# Patient Record
Sex: Male | Born: 1940 | Race: White | Hispanic: No | Marital: Married | Smoking: Never smoker
Health system: Southern US, Community
[De-identification: ages and names within clinical notes are randomized; demographics above are authoritative.]

## PROBLEM LIST (undated history)

## (undated) DIAGNOSIS — Z952 Presence of prosthetic heart valve: Secondary | ICD-10-CM

## (undated) DIAGNOSIS — E669 Obesity, unspecified: Secondary | ICD-10-CM

## (undated) DIAGNOSIS — E1169 Type 2 diabetes mellitus with other specified complication: Secondary | ICD-10-CM

## (undated) DIAGNOSIS — I519 Heart disease, unspecified: Secondary | ICD-10-CM

## (undated) DIAGNOSIS — I10 Essential (primary) hypertension: Secondary | ICD-10-CM

## (undated) HISTORY — PX: BACK SURGERY: SHX140

## (undated) HISTORY — PX: VALVE REPLACEMENT: SUR13

## (undated) HISTORY — PX: APPENDECTOMY: SHX54

---

## 1999-04-07 ENCOUNTER — Encounter (HOSPITAL_BASED_OUTPATIENT_CLINIC_OR_DEPARTMENT_OTHER): Payer: Self-pay | Admitting: General Surgery

## 1999-04-08 ENCOUNTER — Inpatient Hospital Stay (HOSPITAL_COMMUNITY): Admission: EM | Admit: 1999-04-08 | Discharge: 1999-04-09 | Payer: Self-pay | Admitting: Emergency Medicine

## 2012-11-02 ENCOUNTER — Emergency Department (HOSPITAL_COMMUNITY): Payer: PRIVATE HEALTH INSURANCE

## 2012-11-02 ENCOUNTER — Emergency Department (HOSPITAL_COMMUNITY)
Admission: EM | Admit: 2012-11-02 | Discharge: 2012-11-02 | Disposition: A | Discharge status: Home / Self Care | Payer: PRIVATE HEALTH INSURANCE | Attending: Emergency Medicine | Admitting: Emergency Medicine

## 2012-11-02 ENCOUNTER — Encounter (HOSPITAL_COMMUNITY): Payer: Self-pay | Admitting: Emergency Medicine

## 2012-11-02 DIAGNOSIS — Z79899 Other long term (current) drug therapy: Secondary | ICD-10-CM | POA: Insufficient documentation

## 2012-11-02 DIAGNOSIS — Z7901 Long term (current) use of anticoagulants: Secondary | ICD-10-CM | POA: Insufficient documentation

## 2012-11-02 DIAGNOSIS — Z9089 Acquired absence of other organs: Secondary | ICD-10-CM | POA: Insufficient documentation

## 2012-11-02 DIAGNOSIS — K5900 Constipation, unspecified: Secondary | ICD-10-CM | POA: Insufficient documentation

## 2012-11-02 DIAGNOSIS — Z8679 Personal history of other diseases of the circulatory system: Secondary | ICD-10-CM | POA: Insufficient documentation

## 2012-11-02 DIAGNOSIS — N200 Calculus of kidney: Secondary | ICD-10-CM

## 2012-11-02 DIAGNOSIS — N23 Unspecified renal colic: Secondary | ICD-10-CM | POA: Insufficient documentation

## 2012-11-02 HISTORY — DX: Heart disease, unspecified: I51.9

## 2012-11-02 LAB — URINALYSIS, ROUTINE W REFLEX MICROSCOPIC
Nitrite: NEGATIVE
Specific Gravity, Urine: 1.03 (ref 1.005–1.030)
Urobilinogen, UA: 0.2 mg/dL (ref 0.0–1.0)
pH: 5 (ref 5.0–8.0)

## 2012-11-02 LAB — PROTIME-INR: Prothrombin Time: 29.4 seconds — ABNORMAL HIGH (ref 11.6–15.2)

## 2012-11-02 LAB — CBC WITH DIFFERENTIAL/PLATELET
Eosinophils Absolute: 0 10*3/uL (ref 0.0–0.7)
Hemoglobin: 16.3 g/dL (ref 13.0–17.0)
Lymphocytes Relative: 7 % — ABNORMAL LOW (ref 12–46)
Lymphs Abs: 1.3 10*3/uL (ref 0.7–4.0)
Monocytes Relative: 8 % (ref 3–12)
Neutro Abs: 17 10*3/uL — ABNORMAL HIGH (ref 1.7–7.7)
Neutrophils Relative %: 85 % — ABNORMAL HIGH (ref 43–77)
Platelets: 279 10*3/uL (ref 150–400)
RBC: 5.27 MIL/uL (ref 4.22–5.81)
WBC: 20 10*3/uL — ABNORMAL HIGH (ref 4.0–10.5)

## 2012-11-02 LAB — BASIC METABOLIC PANEL
Chloride: 94 mEq/L — ABNORMAL LOW (ref 96–112)
GFR calc non Af Amer: 53 mL/min — ABNORMAL LOW (ref >90)
Glucose, Bld: 197 mg/dL — ABNORMAL HIGH (ref 70–99)
Potassium: 4.2 mEq/L (ref 3.5–5.1)
Sodium: 134 mEq/L — ABNORMAL LOW (ref 135–145)

## 2012-11-02 LAB — HEPATIC FUNCTION PANEL
ALT: 17 U/L (ref 0–53)
Albumin: 3.8 g/dL (ref 3.5–5.2)
Indirect Bilirubin: 0.8 mg/dL (ref 0.3–0.9)
Total Protein: 6.9 g/dL (ref 6.0–8.3)

## 2012-11-02 LAB — URINE MICROSCOPIC-ADD ON

## 2012-11-02 LAB — LIPASE, BLOOD: Lipase: 21 U/L (ref 11–59)

## 2012-11-02 MED ORDER — IOHEXOL 300 MG/ML  SOLN
50.0000 mL | Freq: Once | INTRAMUSCULAR | Status: AC | PRN
  Administered 2012-11-02: 50 mL via ORAL

## 2012-11-02 MED ORDER — SODIUM CHLORIDE 0.9 % IV BOLUS (SEPSIS)
1000.0000 mL | Freq: Once | INTRAVENOUS | Status: AC
  Administered 2012-11-02: 1000 mL via INTRAVENOUS

## 2012-11-02 MED ORDER — HYDROCODONE-ACETAMINOPHEN 5-325 MG PO TABS: 1.0000 | ORAL_TABLET | Freq: Four times a day (QID) | ORAL | Status: DC | PRN

## 2012-11-02 MED ORDER — TAMSULOSIN HCL 0.4 MG PO CAPS: 0.4000 mg | ORAL_CAPSULE | Freq: Every day | ORAL | Status: DC

## 2012-11-02 MED ORDER — IOHEXOL 300 MG/ML  SOLN
100.0000 mL | Freq: Once | INTRAMUSCULAR | Status: AC | PRN
  Administered 2012-11-02: 100 mL via INTRAVENOUS

## 2012-11-02 MED ORDER — ONDANSETRON HCL 4 MG/2ML IJ SOLN
4.0000 mg | Freq: Once | INTRAMUSCULAR | Status: AC
  Administered 2012-11-02: 4 mg via INTRAVENOUS
  Filled 2012-11-02: qty 2

## 2012-11-02 MED ORDER — HYDROMORPHONE HCL PF 1 MG/ML IJ SOLN
1.0000 mg | Freq: Once | INTRAMUSCULAR | Status: AC
  Administered 2012-11-02: 1 mg via INTRAVENOUS
  Filled 2012-11-02: qty 1

## 2012-11-02 NOTE — ED Notes (Signed)
Patient transported to CT 

## 2012-11-02 NOTE — ED Provider Notes (Addendum)
Pt with CT that showed 5x5 right renal calculi which explains his pain.  Also blood in the urine without infection.  Pt is comfortable on exam.  Discussed with pt and he will f/u with urology if sx do not improve.  Gwyneth Sprout, MD 11/02/12 1730  Gwyneth Sprout, MD 11/02/12 1736

## 2012-11-02 NOTE — Progress Notes (Signed)
Pt confirms pcp is Wonda Cerise at [facility] EPIC updated

## 2012-11-02 NOTE — ED Notes (Signed)
Pt has had abd pain and no bm  For a few days and gave self enema with no results. Has an abd hernia also and it has been hurting him. N/v today.

## 2012-11-02 NOTE — ED Provider Notes (Signed)
History     CSN: [id number]  Arrival date & time 11/02/12  1308   First MD Initiated Contact with Patient 11/02/12 1334      Chief Complaint  Patient presents with  . Abdominal Pain  . Constipation    (Consider location/radiation/quality/duration/timing/severity/associated sxs/prior treatment) Patient is a 72 y.o. male presenting with abdominal pain and constipation. The history is provided by the patient (the pt complains of abd pain and constipation). No language interpreter was used.  Abdominal Pain The primary symptoms of the illness include abdominal pain. The primary symptoms of the illness do not include fatigue or diarrhea. The current episode started 13 to 24 hours ago. The onset of the illness was gradual. The problem has not changed since onset. Associated with: nothing. The patient has not had a change in bowel habit. Additional symptoms associated with the illness include constipation. Symptoms associated with the illness do not include hematuria, frequency or back pain. Significant associated medical issues do not include sickle cell disease.  Constipation  Associated symptoms include abdominal pain. Pertinent negatives include no diarrhea, no hematuria, no chest pain, no headaches, no coughing and no rash.    Past Medical History  Diagnosis Date  . Heart disorder     valve     Past Surgical History  Procedure Date  . Appendectomy   . Back surgery   . Valve replacement     No family history on file.  History  Substance Use Topics  . Smoking status: Not on file  . Smokeless tobacco: Not on file  . Alcohol Use:       Review of Systems  Constitutional: Negative for fatigue.  HENT: Negative for congestion, sinus pressure and ear discharge.   Eyes: Negative for discharge.  Respiratory: Negative for cough.   Cardiovascular: Negative for chest pain.  Gastrointestinal: Positive for abdominal pain and constipation. Negative for diarrhea.  Genitourinary:  Negative for frequency and hematuria.  Musculoskeletal: Negative for back pain.  Skin: Negative for rash.  Neurological: Negative for seizures and headaches.  Hematological: Negative.   Psychiatric/Behavioral: Negative for hallucinations.    Allergies  Review of patient's allergies indicates no known allergies.  Home Medications   Current Outpatient Rx  Name  Route  Sig  Dispense  Refill  . ALPHA LIPOIC ACID PO   Oral   Take 1 capsule by mouth daily.         Marland Kitchen VITAMIN B-12 PO   Oral   Take 1 tablet by mouth daily.         Marland Kitchen EZETIMIBE 10 MG PO TABS   Oral   Take 10 mg by mouth daily.         . OMEGA-3 FATTY ACIDS 1000 MG PO CAPS   Oral   Take 1 g by mouth daily.         Marland Kitchen FOLIC ACID 1 MG PO TABS   Oral   Take 1 mg by mouth daily.         Marland Kitchen GARLIC PO   Oral   Take 1 tablet by mouth daily.         Marland Kitchen GLIMEPIRIDE 4 MG PO TABS   Oral   Take 4 mg by mouth daily before breakfast.         . LISINOPRIL-HYDROCHLOROTHIAZIDE 20-12.5 MG PO TABS   Oral   Take 1 tablet by mouth daily.         Marland Kitchen METFORMIN HCL 1000 MG PO TABS  Oral   Take 1,000 mg by mouth 2 (two) times daily with a meal.         . METOPROLOL TARTRATE 100 MG PO TABS   Oral   Take 100 mg by mouth daily.         Marland Kitchen ROSUVASTATIN CALCIUM 20 MG PO TABS   Oral   Take 20 mg by mouth daily.         . WARFARIN SODIUM 7.5 MG PO TABS   Oral   Take 7.5 mg by mouth at bedtime.           BP 185/96  Pulse 88  Temp 98 F (36.7 C) (Oral)  Resp 20  SpO2 100%  Physical Exam  Constitutional: He is oriented to person, place, and time. He appears well-developed.  HENT:  Head: Normocephalic and atraumatic.  Eyes: Conjunctivae normal and EOM are normal. No scleral icterus.  Neck: Neck supple. No thyromegaly present.  Cardiovascular: Normal rate and regular rhythm.  Exam reveals no gallop and no friction rub.   No murmur heard. Pulmonary/Chest: No stridor. He has no wheezes. He has no  rales. He exhibits no tenderness.  Abdominal: He exhibits no distension. There is tenderness. There is no rebound.       Mild rlq tender  Musculoskeletal: Normal range of motion. He exhibits no edema.  Lymphadenopathy:    He has no cervical adenopathy.  Neurological: He is oriented to person, place, and time. Coordination normal.  Skin: No rash noted. No erythema.  Psychiatric: He has a normal mood and affect. His behavior is normal.    ED Course  Procedures (including critical care time)  Labs Reviewed  CBC WITH DIFFERENTIAL - Abnormal; Notable for the following:    WBC 20.0 (*)     Neutrophils Relative 85 (*)     Neutro Abs 17.0 (*)     Lymphocytes Relative 7 (*)     Monocytes Absolute 1.7 (*)     All other components within normal limits  BASIC METABOLIC PANEL - Abnormal; Notable for the following:    Sodium 134 (*)     Chloride 94 (*)     Glucose, Bld 197 (*)     GFR calc non Af Amer 53 (*)     GFR calc Af Amer 62 (*)     All other components within normal limits  URINALYSIS, ROUTINE W REFLEX MICROSCOPIC - Abnormal; Notable for the following:    Hgb urine dipstick MODERATE (*)     Ketones, ur 40 (*)     Protein, ur 100 (*)     All other components within normal limits  PROTIME-INR - Abnormal; Notable for the following:    Prothrombin Time 29.4 (*)     INR 2.98 (*)     All other components within normal limits  URINE MICROSCOPIC-ADD ON - Abnormal; Notable for the following:    Casts HYALINE CASTS (*)     All other components within normal limits  LIPASE, BLOOD  HEPATIC FUNCTION PANEL   Dg Abd Acute W/chest  11/02/2012  *RADIOLOGY REPORT*  Clinical Data: Abdominal pain and constipation.  ACUTE ABDOMEN SERIES (ABDOMEN 2 VIEW & CHEST 1 VIEW)  Comparison: None.  Findings: The lungs show bibasilar atelectasis.  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  Abdominal films show some degree of gaseous distention of the  colon with mild gaseous prominence of small bowel.  A few air-fluid  levels are present in the colon and findings may be consistent with enteritis and associated ileus. There is no evidence of overt bowel obstruction or signs of free air.  No abnormal calcifications are seen.  IMPRESSION: Some gaseous prominence of the colon and, to a lesser extent, small bowel.  Findings may be consistent with ileus/enteritis.   Original Report Authenticated By: Irish Lack, M.D.      No diagnosis found.    MDM          Benny Lennert, MD 11/02/12 [phone]

## 2021-03-21 ENCOUNTER — Inpatient Hospital Stay (HOSPITAL_COMMUNITY): Payer: Medicare Other

## 2021-03-21 ENCOUNTER — Emergency Department (HOSPITAL_BASED_OUTPATIENT_CLINIC_OR_DEPARTMENT_OTHER): Payer: Medicare Other

## 2021-03-21 ENCOUNTER — Encounter (HOSPITAL_BASED_OUTPATIENT_CLINIC_OR_DEPARTMENT_OTHER): Payer: Self-pay

## 2021-03-21 ENCOUNTER — Inpatient Hospital Stay (HOSPITAL_BASED_OUTPATIENT_CLINIC_OR_DEPARTMENT_OTHER)
Admission: EM | Admit: 2021-03-21 | Discharge: 2021-03-23 | DRG: 065 | Disposition: A | Discharge status: Home Health | Payer: Medicare Other | Attending: Internal Medicine | Admitting: Internal Medicine

## 2021-03-21 ENCOUNTER — Other Ambulatory Visit: Payer: Self-pay

## 2021-03-21 DIAGNOSIS — Z87891 Personal history of nicotine dependence: Secondary | ICD-10-CM

## 2021-03-21 DIAGNOSIS — I6389 Other cerebral infarction: Secondary | ICD-10-CM | POA: Diagnosis not present

## 2021-03-21 DIAGNOSIS — E876 Hypokalemia: Secondary | ICD-10-CM | POA: Diagnosis present

## 2021-03-21 DIAGNOSIS — I10 Essential (primary) hypertension: Secondary | ICD-10-CM | POA: Diagnosis present

## 2021-03-21 DIAGNOSIS — R471 Dysarthria and anarthria: Secondary | ICD-10-CM | POA: Diagnosis present

## 2021-03-21 DIAGNOSIS — R4701 Aphasia: Secondary | ICD-10-CM | POA: Diagnosis present

## 2021-03-21 DIAGNOSIS — E785 Hyperlipidemia, unspecified: Secondary | ICD-10-CM | POA: Diagnosis present

## 2021-03-21 DIAGNOSIS — E1165 Type 2 diabetes mellitus with hyperglycemia: Secondary | ICD-10-CM | POA: Diagnosis present

## 2021-03-21 DIAGNOSIS — R29702 NIHSS score 2: Secondary | ICD-10-CM | POA: Diagnosis present

## 2021-03-21 DIAGNOSIS — R8271 Bacteriuria: Secondary | ICD-10-CM | POA: Diagnosis present

## 2021-03-21 DIAGNOSIS — Z952 Presence of prosthetic heart valve: Secondary | ICD-10-CM

## 2021-03-21 DIAGNOSIS — R29810 Facial weakness: Secondary | ICD-10-CM | POA: Diagnosis present

## 2021-03-21 DIAGNOSIS — R3129 Other microscopic hematuria: Secondary | ICD-10-CM | POA: Diagnosis present

## 2021-03-21 DIAGNOSIS — E1169 Type 2 diabetes mellitus with other specified complication: Secondary | ICD-10-CM | POA: Diagnosis present

## 2021-03-21 DIAGNOSIS — T45515A Adverse effect of anticoagulants, initial encounter: Secondary | ICD-10-CM | POA: Diagnosis present

## 2021-03-21 DIAGNOSIS — I6330 Cerebral infarction due to thrombosis of unspecified cerebral artery: Secondary | ICD-10-CM | POA: Diagnosis not present

## 2021-03-21 DIAGNOSIS — Z823 Family history of stroke: Secondary | ICD-10-CM

## 2021-03-21 DIAGNOSIS — Z7984 Long term (current) use of oral hypoglycemic drugs: Secondary | ICD-10-CM | POA: Diagnosis not present

## 2021-03-21 DIAGNOSIS — Z8249 Family history of ischemic heart disease and other diseases of the circulatory system: Secondary | ICD-10-CM

## 2021-03-21 DIAGNOSIS — E119 Type 2 diabetes mellitus without complications: Secondary | ICD-10-CM | POA: Diagnosis present

## 2021-03-21 DIAGNOSIS — D72829 Elevated white blood cell count, unspecified: Secondary | ICD-10-CM | POA: Diagnosis present

## 2021-03-21 DIAGNOSIS — Z20822 Contact with and (suspected) exposure to covid-19: Secondary | ICD-10-CM | POA: Diagnosis present

## 2021-03-21 DIAGNOSIS — R8290 Unspecified abnormal findings in urine: Secondary | ICD-10-CM | POA: Diagnosis present

## 2021-03-21 DIAGNOSIS — G8191 Hemiplegia, unspecified affecting right dominant side: Secondary | ICD-10-CM | POA: Diagnosis present

## 2021-03-21 DIAGNOSIS — E669 Obesity, unspecified: Secondary | ICD-10-CM | POA: Diagnosis present

## 2021-03-21 DIAGNOSIS — I6381 Other cerebral infarction due to occlusion or stenosis of small artery: Principal | ICD-10-CM | POA: Diagnosis present

## 2021-03-21 DIAGNOSIS — Z79899 Other long term (current) drug therapy: Secondary | ICD-10-CM | POA: Diagnosis not present

## 2021-03-21 DIAGNOSIS — Z6830 Body mass index (BMI) 30.0-30.9, adult: Secondary | ICD-10-CM

## 2021-03-21 DIAGNOSIS — I639 Cerebral infarction, unspecified: Secondary | ICD-10-CM

## 2021-03-21 DIAGNOSIS — R531 Weakness: Secondary | ICD-10-CM | POA: Diagnosis not present

## 2021-03-21 DIAGNOSIS — Z7901 Long term (current) use of anticoagulants: Secondary | ICD-10-CM | POA: Diagnosis not present

## 2021-03-21 HISTORY — DX: Presence of prosthetic heart valve: Z95.2

## 2021-03-21 HISTORY — DX: Type 2 diabetes mellitus with other specified complication: E11.69

## 2021-03-21 HISTORY — DX: Essential (primary) hypertension: I10

## 2021-03-21 HISTORY — DX: Obesity, unspecified: E66.9

## 2021-03-21 LAB — DIFFERENTIAL
Abs Immature Granulocytes: 0.03 10*3/uL (ref 0.00–0.07)
Basophils Absolute: 0.1 10*3/uL (ref 0.0–0.1)
Basophils Relative: 1 %
Eosinophils Absolute: 0.1 10*3/uL (ref 0.0–0.5)
Eosinophils Relative: 1 %
Immature Granulocytes: 0 %
Lymphocytes Relative: 10 %
Lymphs Abs: 1 10*3/uL (ref 0.7–4.0)
Monocytes Absolute: 0.8 10*3/uL (ref 0.1–1.0)
Monocytes Relative: 8 %
Neutro Abs: 8.7 10*3/uL — ABNORMAL HIGH (ref 1.7–7.7)
Neutrophils Relative %: 80 %

## 2021-03-21 LAB — URINALYSIS, ROUTINE W REFLEX MICROSCOPIC
Bilirubin Urine: NEGATIVE
Glucose, UA: NEGATIVE mg/dL
Ketones, ur: 15 mg/dL — AB
Leukocytes,Ua: NEGATIVE
Nitrite: NEGATIVE
Protein, ur: >300 mg/dL — AB
Specific Gravity, Urine: 1.01 (ref 1.005–1.030)
pH: 6 (ref 5.0–8.0)

## 2021-03-21 LAB — COMPREHENSIVE METABOLIC PANEL
ALT: 25 U/L (ref 0–44)
AST: 28 U/L (ref 15–41)
Albumin: 4.2 g/dL (ref 3.5–5.0)
Alkaline Phosphatase: 70 U/L (ref 38–126)
Anion gap: 11 (ref 5–15)
BUN: 10 mg/dL (ref 8–23)
CO2: 26 mmol/L (ref 22–32)
Calcium: 8.8 mg/dL — ABNORMAL LOW (ref 8.9–10.3)
Chloride: 101 mmol/L (ref 98–111)
Creatinine, Ser: 1.02 mg/dL (ref 0.61–1.24)
GFR, Estimated: >60 mL/min (ref >60)
Glucose, Bld: 154 mg/dL — ABNORMAL HIGH (ref 70–99)
Potassium: 3.6 mmol/L (ref 3.5–5.1)
Sodium: 138 mmol/L (ref 135–145)
Total Bilirubin: 1 mg/dL (ref 0.3–1.2)
Total Protein: 7 g/dL (ref 6.5–8.1)

## 2021-03-21 LAB — URINALYSIS, MICROSCOPIC (REFLEX)

## 2021-03-21 LAB — LIPID PANEL
Cholesterol: 108 mg/dL (ref 0–200)
HDL: 30 mg/dL — ABNORMAL LOW (ref >40)
LDL Cholesterol: 58 mg/dL (ref 0–99)
Total CHOL/HDL Ratio: 3.6 RATIO
Triglycerides: 98 mg/dL (ref <150)
VLDL: 20 mg/dL (ref 0–40)

## 2021-03-21 LAB — RAPID URINE DRUG SCREEN, HOSP PERFORMED
Amphetamines: NOT DETECTED
Barbiturates: NOT DETECTED
Benzodiazepines: NOT DETECTED
Cocaine: NOT DETECTED
Opiates: NOT DETECTED
Tetrahydrocannabinol: NOT DETECTED

## 2021-03-21 LAB — CBC
HCT: 48.4 % (ref 39.0–52.0)
Hemoglobin: 16.4 g/dL (ref 13.0–17.0)
MCH: 30.4 pg (ref 26.0–34.0)
MCHC: 33.9 g/dL (ref 30.0–36.0)
MCV: 89.8 fL (ref 80.0–100.0)
Platelets: 252 10*3/uL (ref 150–400)
RBC: 5.39 MIL/uL (ref 4.22–5.81)
RDW: 13.1 % (ref 11.5–15.5)
WBC: 10.7 10*3/uL — ABNORMAL HIGH (ref 4.0–10.5)
nRBC: 0 % (ref 0.0–0.2)

## 2021-03-21 LAB — ETHANOL: Alcohol, Ethyl (B): <10 mg/dL (ref <10)

## 2021-03-21 LAB — PROTIME-INR
INR: 3.4 — ABNORMAL HIGH (ref 0.8–1.2)
Prothrombin Time: 34.3 seconds — ABNORMAL HIGH (ref 11.4–15.2)

## 2021-03-21 LAB — CBG MONITORING, ED: Glucose-Capillary: 167 mg/dL — ABNORMAL HIGH (ref 70–99)

## 2021-03-21 LAB — APTT: aPTT: 46 seconds — ABNORMAL HIGH (ref 24–36)

## 2021-03-21 LAB — RESP PANEL BY RT-PCR (FLU A&B, COVID) ARPGX2
Influenza A by PCR: NEGATIVE
Influenza B by PCR: NEGATIVE
SARS Coronavirus 2 by RT PCR: NEGATIVE

## 2021-03-21 LAB — GLUCOSE, CAPILLARY: Glucose-Capillary: 179 mg/dL — ABNORMAL HIGH (ref 70–99)

## 2021-03-21 MED ORDER — ROSUVASTATIN CALCIUM 20 MG PO TABS
20.0000 mg | ORAL_TABLET | Freq: Every day | ORAL | Status: DC
  Administered 2021-03-21 – 2021-03-23 (×3): 20 mg via ORAL
  Filled 2021-03-21 (×3): qty 1

## 2021-03-21 MED ORDER — CALCIUM GLUCONATE-NACL 1-0.675 GM/50ML-% IV SOLN
1.0000 g | Freq: Once | INTRAVENOUS | Status: AC
  Administered 2021-03-21: 1000 mg via INTRAVENOUS
  Filled 2021-03-21: qty 50

## 2021-03-21 MED ORDER — WARFARIN SODIUM 7.5 MG PO TABS
7.5000 mg | ORAL_TABLET | Freq: Once | ORAL | Status: AC
  Administered 2021-03-21: 7.5 mg via ORAL
  Filled 2021-03-21: qty 1

## 2021-03-21 MED ORDER — EZETIMIBE 10 MG PO TABS
10.0000 mg | ORAL_TABLET | Freq: Every day | ORAL | Status: DC
  Administered 2021-03-21 – 2021-03-23 (×3): 10 mg via ORAL
  Filled 2021-03-21 (×3): qty 1

## 2021-03-21 MED ORDER — INSULIN ASPART 100 UNIT/ML IJ SOLN
0.0000 [IU] | Freq: Three times a day (TID) | INTRAMUSCULAR | Status: DC
  Administered 2021-03-22: 2 [IU] via SUBCUTANEOUS
  Administered 2021-03-22: 5 [IU] via SUBCUTANEOUS
  Administered 2021-03-22: 2 [IU] via SUBCUTANEOUS
  Administered 2021-03-23: 3 [IU] via SUBCUTANEOUS

## 2021-03-21 MED ORDER — SENNOSIDES-DOCUSATE SODIUM 8.6-50 MG PO TABS: 1.0000 | ORAL_TABLET | Freq: Every evening | ORAL | Status: DC | PRN

## 2021-03-21 MED ORDER — STROKE: EARLY STAGES OF RECOVERY BOOK
Freq: Once | Status: AC
  Filled 2021-03-21: qty 1

## 2021-03-21 MED ORDER — ACETAMINOPHEN 160 MG/5ML PO SOLN: 650.0000 mg | ORAL | Status: DC | PRN

## 2021-03-21 MED ORDER — INSULIN ASPART 100 UNIT/ML IJ SOLN: 0.0000 [IU] | Freq: Every day | INTRAMUSCULAR | Status: DC

## 2021-03-21 MED ORDER — WARFARIN - PHARMACIST DOSING INPATIENT: Freq: Every day | Status: DC

## 2021-03-21 MED ORDER — ACETAMINOPHEN 650 MG RE SUPP: 650.0000 mg | RECTAL | Status: DC | PRN

## 2021-03-21 MED ORDER — ACETAMINOPHEN 325 MG PO TABS
650.0000 mg | ORAL_TABLET | ORAL | Status: DC | PRN
  Administered 2021-03-22: 650 mg via ORAL
  Filled 2021-03-21: qty 2

## 2021-03-21 NOTE — ED Notes (Signed)
Attempted to call patient's wife to inform her of patient room at [facility] and [facility] at bedside for transfer. No answer at this time.

## 2021-03-21 NOTE — ED Provider Notes (Signed)
[facility] EMERGENCY DEPARTMENT Provider Note  CSN: [id number] Arrival date & time: 03/21/21 0850    History Chief Complaint  Patient presents with   Aphasia    Joseph Padilla is a 80 y.o. 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 He thought maybe it was an allergic reaction to something he ate. He denies any headache, blurry vision, numbness or weakness in his arms or legs. He states he feels a little off balance with walking but has not had a fall or any injuries.    Past Medical History:  Diagnosis Date   Heart disorder    valve     Past Surgical History:  Procedure Laterality Date   APPENDECTOMY     BACK SURGERY     VALVE REPLACEMENT      History reviewed. No pertinent family history.  Social History   Tobacco Use   Smoking status: Never   Smokeless tobacco: Never  Vaping Use   Vaping Use: Never used  Substance Use Topics   Alcohol use: Never   Drug use: Never     Home Medications Prior to Admission medications   Medication Sig Start Date End Date Taking? Authorizing Provider  ALPHA LIPOIC ACID PO Take 1 capsule by mouth daily.    [provider]  Cyanocobalamin (VITAMIN B-12 PO) Take 1 tablet by mouth daily.    [provider]  ezetimibe (ZETIA) 10 MG tablet Take 10 mg by mouth daily.    [provider]  fish oil-omega-3 fatty acids 1000 MG capsule Take 1 g by mouth daily.    [provider]  folic acid (FOLVITE) 1 MG tablet Take 1 mg by mouth daily.    [provider]  GARLIC PO Take 1 tablet by mouth daily.    [provider]  glimepiride (AMARYL) 4 MG tablet Take 4 mg by mouth daily before breakfast.    [provider]  HYDROcodone-acetaminophen (NORCO/VICODIN) 5-325 MG per tablet Take 1 tablet by mouth every 6 (six) hours as needed for pain. 11/02/12   Gwyneth Sprout,  MD  lisinopril-hydrochlorothiazide (PRINZIDE,ZESTORETIC) 20-12.5 MG per tablet Take 1 tablet by mouth daily.    [provider]  metFORMIN (GLUCOPHAGE) 1000 MG tablet Take 1,000 mg by mouth 2 (two) times daily with a meal.    [provider]  metoprolol (LOPRESSOR) 100 MG tablet Take 100 mg by mouth daily.    [provider]  rosuvastatin (CRESTOR) 20 MG tablet Take 20 mg by mouth daily.    [provider]  Tamsulosin HCl (FLOMAX) 0.4 MG CAPS Take 1 capsule (0.4 mg total) by mouth daily after supper. 11/02/12   Gwyneth Sprout, MD  warfarin (COUMADIN) 7.5 MG tablet Take 7.5 mg by mouth at bedtime.    [provider]     Allergies    Patient has no known allergies.   Review of Systems   Review of Systems A comprehensive review of systems was completed and negative except as noted in HPI.    Physical Exam BP (!) 165/93   Pulse 88   Temp 98 F (36.7 C) (Oral)   Resp (!) 24   Ht 5\' 11"  (1.803 m)   Wt 100.5 kg   SpO2 97%   BMI 30.89 kg/m   Physical Exam Vitals and nursing note reviewed.  Constitutional:      Appearance: Normal appearance.  HENT:     Head: Normocephalic and atraumatic.     Nose: Nose normal.     Mouth/Throat:     Mouth: Mucous membranes are moist.  Eyes:     Extraocular Movements: Extraocular movements intact.     Conjunctiva/sclera: Conjunctivae normal.  Cardiovascular:     Rate and Rhythm: Normal rate.  Pulmonary:     Effort: Pulmonary effort is normal.     Breath sounds: Normal breath sounds.  Abdominal:     General: Abdomen is flat.     Palpations: Abdomen is soft.     Tenderness: There is no abdominal tenderness.  Musculoskeletal:        General: No swelling. Normal range of motion.     Cervical back: Neck supple.  Skin:    General: Skin is warm and dry.  Neurological:     Mental Status: He is alert.     Cranial Nerves: Cranial nerve deficit (R facial droop, mild dysarthria) present.     Sensory:  No sensory deficit.     Motor: No weakness.     Coordination: Coordination normal.  Psychiatric:        Mood and Affect: Mood normal.     ED Results / Procedures / Treatments   Labs (all labs ordered are listed, but only abnormal results are displayed) Labs Reviewed  PROTIME-INR - Abnormal; Notable for the following components:      Result Value   Prothrombin Time 34.3 (*)    INR 3.4 (*)    All other components within normal limits  APTT - Abnormal; Notable for the following components:   aPTT 46 (*)    All other components within normal limits  CBC - Abnormal; Notable for the following components:   WBC 10.7 (*)    All other components within normal limits  DIFFERENTIAL - Abnormal; Notable for the following components:   Neutro Abs 8.7 (*)    All other components within normal limits  COMPREHENSIVE METABOLIC PANEL - Abnormal; Notable for the following components:   Glucose, Bld 154 (*)    Calcium 8.8 (*)    All other components within normal limits  URINALYSIS, ROUTINE W REFLEX MICROSCOPIC - Abnormal; Notable for the following components:   Hgb urine dipstick MODERATE (*)    Ketones, ur 15 (*)    Protein, ur >300 (*)    All other components within normal limits  URINALYSIS, MICROSCOPIC (REFLEX) - Abnormal; Notable for the following components:   Bacteria, UA MANY (*)    All other components within normal limits  CBG MONITORING, ED - Abnormal; Notable for the following components:   Glucose-Capillary 167 (*)    All other components within normal limits  RESP PANEL BY RT-PCR (FLU A&B, COVID) ARPGX2  ETHANOL  RAPID URINE DRUG SCREEN, HOSP PERFORMED    EKG EKG Interpretation  Date/Time:  Sunday March 21 2021 09:00:39 EDT Ventricular Rate:  83 PR Interval:  174 QRS Duration: 168 QT Interval:  435 QTC Calculation: 512 R Axis:   115 Text Interpretation: Sinus rhythm Ventricular premature complex RBBB and LPFB Since last tracing 12Jul2000 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  Confirmed by Susy Frizzle [phone]) on 03/21/2021 9:08:47 AM   Radiology CT HEAD WO CONTRAST  Result Date: 03/21/2021 CLINICAL DATA:  Slurred speech, right facial droop, and weakness. EXAM: CT HEAD WITHOUT CONTRAST TECHNIQUE: Contiguous axial images were obtained from the base of the skull through the vertex without intravenous contrast. COMPARISON:  None. FINDINGS: Brain: There is  no evidence of an acute large territory infarct, intracranial hemorrhage, mass, midline shift, or extra-axial fluid collection. There is mild cerebral atrophy. Patchy hypodensities in the cerebral white matter bilaterally are nonspecific but compatible with moderate chronic small vessel ischemic disease. There are lacunar infarcts in the basal ganglia/deep white matter bilaterally which are of indeterminate age. A small chronic infarct is noted inferiorly in the left cerebellar hemisphere. Vascular: Calcified atherosclerosis at the skull base. No hyperdense vessel. Skull: No fracture or suspicious osseous lesion. Sinuses/Orbits: Minimal mucosal thickening in the left maxillary sinus. Clear mastoid air cells. Unremarkable orbits. Other: None. IMPRESSION: 1. No evidence of an acute large territory infarct or intracranial hemorrhage. 2. Moderate chronic small vessel ischemic disease with age indeterminate lacunar infarcts in the basal ganglia/deep white matter bilaterally. 3. Small chronic left cerebellar infarct. Electronically Signed   By: Sebastian Ache M.D.   On: 03/21/2021 10:00    Procedures Procedures  Medications Ordered in the ED Medications - No data to display   MDM Rules/Calculators/A&P MDM Patient with symptoms concerning for acute stroke. Given onset ~24hours ago he is not a candidate for tPA. He is on coumadin, raising concern for ICH. Will send for head CT. Check labs and EKG.   ED Course  I have reviewed the triage vital signs and the nursing notes.  Pertinent labs & imaging results that were available  during my care of the patient were reviewed by me and considered in my medical decision making (see chart for details).  Clinical Course as of 03/21/21 1210  Sun Mar 21, 2021  1749 CBC, CMP are unremarkable INR is therapeutic.  [CS]  1009 CT head imaging reviewed, no acute process, old chronic appearing disease. Will plan admission for further evaluation of his dysarthria and facial droop.  [CS]  1036 Spoke with Dr. Arlyss Queen, who will accept for admission.  [CS]    Clinical Course User Index [CS] Pollyann Savoy, MD    Final Clinical Impression(s) / ED Diagnoses Final diagnoses:  Dysarthria    Rx / DC Orders ED Discharge Orders     None        Pollyann Savoy, MD 03/21/21 1210

## 2021-03-21 NOTE — Progress Notes (Signed)
ANTICOAGULATION CONSULT NOTE - Initial Consult  Pharmacy Consult for warfarin Indication: atrial fibrillation and mechanical aortic valve  No Known Allergies  Patient Measurements: Height: 5\' 11"  (180.3 cm) Weight: 100.5 kg (221 lb 8 oz) IBW/kg (Calculated) : 75.3   Vital Signs: Temp: 98 F (36.7 C) (06/26 1507) Temp Source: Oral (06/26 1507) BP: 166/84 (06/26 1507) Pulse Rate: 97 (06/26 1507)  Labs: Recent Labs    03/21/21 0905  HGB 16.4  HCT 48.4  PLT 252  APTT 46*  LABPROT 34.3*  INR 3.4*  CREATININE 1.02    Estimated Creatinine Clearance: 72.1 mL/min (by C-G formula based on SCr of 1.02 mg/dL).   Medical History: Past Medical History:  Diagnosis Date   Heart disorder    valve     Medications:  Medications Prior to Admission  Medication Sig Dispense Refill Last Dose   metFORMIN (GLUCOPHAGE) 1000 MG tablet Take 1,000 mg by mouth 2 (two) times daily with a meal.   03/21/2021   warfarin (COUMADIN) 7.5 MG tablet Take 7.5 mg by mouth at bedtime.   03/20/2021 at 1800   ALPHA LIPOIC ACID PO Take 1 capsule by mouth daily.      Cyanocobalamin (VITAMIN B-12 PO) Take 1 tablet by mouth daily.      ezetimibe (ZETIA) 10 MG tablet Take 10 mg by mouth daily.      fish oil-omega-3 fatty acids 1000 MG capsule Take 1 g by mouth daily.      folic acid (FOLVITE) 1 MG tablet Take 1 mg by mouth daily.      GARLIC PO Take 1 tablet by mouth daily.      glimepiride (AMARYL) 4 MG tablet Take 4 mg by mouth daily before breakfast.      HYDROcodone-acetaminophen (NORCO/VICODIN) 5-325 MG per tablet Take 1 tablet by mouth every 6 (six) hours as needed for pain. 15 tablet 0    lisinopril-hydrochlorothiazide (PRINZIDE,ZESTORETIC) 20-12.5 MG per tablet Take 1 tablet by mouth daily.      metoprolol (LOPRESSOR) 100 MG tablet Take 100 mg by mouth daily.      rosuvastatin (CRESTOR) 20 MG tablet Take 20 mg by mouth daily.      Tamsulosin HCl (FLOMAX) 0.4 MG CAPS Take 1 capsule (0.4 mg total) by  mouth daily after supper. 5 capsule 0     Assessment: 80 year old male admitted with slurred speech and facial droop transferred from [facility]. Patient with history of chronic anticoagulation with warfarin due to afib and mechanical aortic valve. INR on admission is within range at 3.4. CBC appears normal. No bleeding issues noted on admission. Patient took his last dose of warfarin yesterday and prior to admit dosing was 7.5mg  daily.   Goal of Therapy:  INR 2.5-3.5 Monitor platelets by anticoagulation protocol: Yes   Plan:  Warfarin 7.5mg  tonight Daily INR  [facility] PharmD., BCPS Clinical Pharmacist 03/21/2021 4:25 PM

## 2021-03-21 NOTE — ED Notes (Signed)
Returns from CT scan, VS reassessed, neuro status reassessed, reinforced NPO status and current plan of care with pt and wife

## 2021-03-21 NOTE — ED Notes (Signed)
Wife brought back to exam room 9, informed of current plan of care. Offered wife beverage, comfort measures

## 2021-03-21 NOTE — ED Notes (Signed)
Patient transported to CT via stretcher, sr x 2 up, fall risk bracelet applied, yellow socks applied.

## 2021-03-21 NOTE — H&P (Addendum)
History and Physical    JASHON ISHIDA RUE:[id number] DOB: 1941-05-24 DOA: 03/21/2021  Referring MD/NP/PA:  PCP: Richmond Campbell., PA-C  Patient coming from: [facility] transfer  Chief Complaint: Slurred speech and facial droop  I have personally briefly reviewed patient's old medical records in [facility]   HPI: ORRIE SCHUBERT is a 80 y.o. 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  Patient reports that he was in his normal state of health prior to going to sleep the night before.  He states that he was drooling out of the right side of his mouth and he was having difficulty combing his hair as well as writing his name.  Normally he is able to ambulate without need of assistance, but found that yesterday he was a little wobbly when trying to get around. Denies having any falls, chest pain, nausea, vomiting or diarrhea. He had initially thought some of his face symptoms were possibly related to an allergic reaction.  He tried taking Benadryl without any improvement in symptoms.  Patient denies any recent changes in any of his medications.  Approximately 2 weeks ago his INR was supratherapeutic, but normally he stays in the 2.5-3.5 range.  He has not had any subtherapeutic values to his knowledge.  ED Course: On admission to [facility] patient was noted to be afebrile, pulse 80-97, respirations 16-24, blood pressure 158/91-188/93, and O2 saturation maintained on room air.  CT scan of the brain showed no evidence of any large territory infarct or intracranial hemorrhage, small chronic left cerebellar infarct, and chronic small vessel ischemic disease with indeterminate lacunar infarcts of the basal ganglia/deep white matter bilaterally.   Patient was not a candidate for tPA due to being on anticoagulation and out of the time window.  Labs significant for WBC 10.7, calcium 8.8, glucose 167 INR 3.4.  UDS was negative.  Urinalysis was significant for negative nitrites, negative leukocytes, many bacteria, 6-10 RBCs, and 11-20 WBCs.  COVID-19 and influenza screening was negative [facility] was notified accepted patient to a medical telemetry bed.  Review of Systems  Constitutional:  Negative for fever and malaise/fatigue.  HENT:  Negative for congestion and nosebleeds.   Eyes:  Negative for blurred vision.  Respiratory:  Negative for cough and shortness of breath.   Cardiovascular:  Negative for chest pain and leg swelling.  Gastrointestinal:  Negative for abdominal pain, nausea and vomiting.  Genitourinary:  Negative for dysuria.  Musculoskeletal:  Negative for joint pain and myalgias.  Skin:  Negative for rash.  Neurological:  Positive for speech change and focal weakness. Negative for loss of consciousness.  Psychiatric/Behavioral:  Negative for memory loss and substance abuse.    Past Medical History:  Diagnosis Date   Heart disorder    valve     Past Surgical History:  Procedure Laterality Date   APPENDECTOMY     BACK SURGERY     VALVE REPLACEMENT       reports that he has never smoked. He has never used smokeless tobacco. He reports that he does not drink alcohol and does not use drugs.  No Known Allergies  History reviewed. No pertinent family history.  Prior to Admission medications   Medication Sig Start Date End Date Taking? Authorizing Provider  metFORMIN (GLUCOPHAGE) 1000 MG tablet  Take 1,000 mg by mouth 2 (two) times daily with a meal.   Yes [provider]  warfarin (COUMADIN) 7.5 MG tablet Take 7.5 mg by mouth at bedtime.   Yes [provider]  ALPHA LIPOIC ACID PO Take 1 capsule by mouth daily.    [provider]  Cyanocobalamin (VITAMIN B-12 PO) Take 1 tablet by mouth daily.     [provider]  ezetimibe (ZETIA) 10 MG tablet Take 10 mg by mouth daily.    [provider]  fish oil-omega-3 fatty acids 1000 MG capsule Take 1 g by mouth daily.    [provider]  folic acid (FOLVITE) 1 MG tablet Take 1 mg by mouth daily.    [provider]  GARLIC PO Take 1 tablet by mouth daily.    [provider]  glimepiride (AMARYL) 4 MG tablet Take 4 mg by mouth daily before breakfast.    [provider]  HYDROcodone-acetaminophen (NORCO/VICODIN) 5-325 MG per tablet Take 1 tablet by mouth every 6 (six) hours as needed for pain. 11/02/12   Gwyneth Sprout, MD  lisinopril-hydrochlorothiazide (PRINZIDE,ZESTORETIC) 20-12.5 MG per tablet Take 1 tablet by mouth daily.    [provider]  metoprolol (LOPRESSOR) 100 MG tablet Take 100 mg by mouth daily.    [provider]  rosuvastatin (CRESTOR) 20 MG tablet Take 20 mg by mouth daily.    [provider]  Tamsulosin HCl (FLOMAX) 0.4 MG CAPS Take 1 capsule (0.4 mg total) by mouth daily after supper. 11/02/12   Gwyneth Sprout, MD    Physical Exam:  Constitutional: Elderly male currently in no acute distress Vitals:   03/21/21 1034 03/21/21 1330 03/21/21 1400 03/21/21 1507  BP: (!) 165/93  (!) 158/91 (!) 166/84  Pulse: 88 88 89 97  Resp: (!) 24  (!) 21 16  Temp:      TempSrc:      SpO2: 97%  94% 99%  Weight:      Height:       Eyes: PERRL, lids and conjunctivae normal ENMT: Mucous membranes moist with right-sided facial droop. Neck: normal, supple, no masses, no thyromegaly Respiratory: clear to auscultation bilaterally, no wheezing, no crackles. Normal respiratory effort. No accessory muscle use.  Cardiovascular: Regular rate and rhythm with positive click.  No extremity edema. 2+ pedal pulses. No carotid bruits.  Abdomen: Protuberant abdomen with no significant tenderness to palpation.  Bowel sounds are positive in all 4 quadrants. Musculoskeletal: no  clubbing / cyanosis. No joint deformity upper and lower extremities. Good ROM, no contractures. Normal muscle tone.  Skin: no rashes, lesions, ulcers. No induration Neurologic: CN 2-12 grossly intact.  Dysarthria with right-sided facial droop and strength 4/5 on the right upper extremity.  Strength appears to be 5/5 in all other extremities Psychiatric: Normal judgment and insight. Alert and oriented x 3. Normal mood.     Labs on Admission: I have personally reviewed following labs and imaging studies  CBC: Recent Labs  Lab 03/21/21 0905  WBC 10.7*  NEUTROABS 8.7*  HGB 16.4  HCT 48.4  MCV 89.8  PLT 252   Basic Metabolic Panel: Recent Labs  Lab 03/21/21 0905  NA 138  K 3.6  CL 101  CO2 26  GLUCOSE 154*  BUN 10  CREATININE 1.02  CALCIUM 8.8*   GFR: Estimated Creatinine Clearance: 72.1 mL/min (by C-G formula based on SCr of 1.02 mg/dL). Liver Function Tests: Recent Labs  Lab 03/21/21 0905  AST 28  ALT 25  ALKPHOS 70  BILITOT 1.0  PROT 7.0  ALBUMIN 4.2   No results for input(s): LIPASE, AMYLASE in the last 168 hours. No results for input(s): AMMONIA in the last 168 hours. Coagulation Profile: Recent Labs  Lab 03/21/21 0905  INR 3.4*   Cardiac Enzymes: No results for input(s): CKTOTAL, CKMB, CKMBINDEX, TROPONINI in the last 168 hours. BNP (last 3 results) No results for input(s): PROBNP in the last 8760 hours. HbA1C: No results for input(s): HGBA1C in the last 72 hours. CBG: Recent Labs  Lab 03/21/21 0903  GLUCAP 167*   Lipid Profile: No results for input(s): CHOL, HDL, LDLCALC, TRIG, CHOLHDL, LDLDIRECT in the last 72 hours. Thyroid Function Tests: No results for input(s): TSH, T4TOTAL, FREET4, T3FREE, THYROIDAB in the last 72 hours. Anemia Panel: No results for input(s): VITAMINB12, FOLATE, FERRITIN, TIBC, IRON, RETICCTPCT in the last 72 hours. Urine analysis:    Component Value Date/Time   COLORURINE YELLOW 03/21/2021 0958   APPEARANCEUR CLEAR  03/21/2021 0958   LABSPEC 1.010 03/21/2021 0958   PHURINE 6.0 03/21/2021 0958   GLUCOSEU NEGATIVE 03/21/2021 0958   HGBUR MODERATE (A) 03/21/2021 0958   BILIRUBINUR NEGATIVE 03/21/2021 0958   KETONESUR 15 (A) 03/21/2021 0958   PROTEINUR >300 (A) 03/21/2021 0958   UROBILINOGEN 0.2 11/02/2012 1457   NITRITE NEGATIVE 03/21/2021 0958   LEUKOCYTESUR NEGATIVE 03/21/2021 0958   Sepsis Labs: Recent Results (from the past 240 hour(s))  Resp Panel by RT-PCR (Flu A&B, Covid) Nasopharyngeal Swab     Status: None   Collection Time: 03/21/21  9:18 AM   Specimen: Nasopharyngeal Swab; Nasopharyngeal(NP) swabs in vial transport medium  Result Value Ref Range Status   SARS Coronavirus 2 by RT PCR NEGATIVE NEGATIVE Final    Comment: (NOTE) SARS-CoV-2 target nucleic acids are NOT DETECTED.  The SARS-CoV-2 RNA is generally detectable in upper respiratory specimens during the acute phase of infection. The lowest concentration of SARS-CoV-2 viral copies this assay can detect is 138 copies/mL. A negative result does not preclude SARS-Cov-2 infection and should not be used as the sole basis for treatment or other patient management decisions. A negative result may occur with  improper specimen collection/handling, submission of specimen other than nasopharyngeal swab, presence of viral mutation(s) within the areas targeted by this assay, and inadequate number of viral copies(<138 copies/mL). A negative result must be combined with clinical observations, patient history, and epidemiological information. The expected result is Negative.  Fact Sheet for Patients:  BloggerCourse.com  Fact Sheet for Healthcare Providers:  SeriousBroker.it  This test is no t yet approved or cleared by the [place] FDA and  has been authorized for detection and/or diagnosis of SARS-CoV-2 by FDA under an Emergency Use Authorization (EUA). This EUA will remain  in  effect (meaning this test can be used) for the duration of the COVID-19 declaration under Section 564(b)(1) of the Act, 21 U.S.C.section 360bbb-3(b)(1), unless the authorization is terminated  or revoked sooner.       Influenza A by PCR NEGATIVE NEGATIVE Final   Influenza B by PCR NEGATIVE NEGATIVE Final    Comment: (NOTE) The Xpert Xpress SARS-CoV-2/FLU/RSV plus assay is intended as an aid in the diagnosis of influenza from Nasopharyngeal swab specimens and should not be used as a sole basis for treatment. Nasal washings and aspirates are unacceptable for Xpert Xpress SARS-CoV-2/FLU/RSV testing.  Fact Sheet for Patients: BloggerCourse.com  Fact Sheet for Healthcare Providers: SeriousBroker.it  This test is not yet approved or cleared  by the [place] and has been authorized for detection and/or diagnosis of SARS-CoV-2 by FDA under an Emergency Use Authorization (EUA). This EUA will remain in effect (meaning this test can be used) for the duration of the COVID-19 declaration under Section 564(b)(1) of the Act, 21 U.S.C. section 360bbb-3(b)(1), unless the authorization is terminated or revoked.  Performed at [facility], [address] [place], [place] [address]      Radiological Exams on Admission: CT HEAD WO CONTRAST  Result Date: 03/21/2021 CLINICAL DATA:  Slurred speech, right facial droop, and weakness. EXAM: CT HEAD WITHOUT CONTRAST TECHNIQUE: Contiguous axial images were obtained from the base of the skull through the vertex without intravenous contrast. COMPARISON:  None. FINDINGS: Brain: There is no evidence of an acute large territory infarct, intracranial hemorrhage, mass, midline shift, or extra-axial fluid collection. There is mild cerebral atrophy. Patchy hypodensities in the cerebral white matter bilaterally are nonspecific but compatible with moderate chronic small vessel ischemic disease. There  are lacunar infarcts in the basal ganglia/deep white matter bilaterally which are of indeterminate age. A small chronic infarct is noted inferiorly in the left cerebellar hemisphere. Vascular: Calcified atherosclerosis at the skull base. No hyperdense vessel. Skull: No fracture or suspicious osseous lesion. Sinuses/Orbits: Minimal mucosal thickening in the left maxillary sinus. Clear mastoid air cells. Unremarkable orbits. Other: None. IMPRESSION: 1. No evidence of an acute large territory infarct or intracranial hemorrhage. 2. Moderate chronic small vessel ischemic disease with age indeterminate lacunar infarcts in the basal ganglia/deep white matter bilaterally. 3. Small chronic left cerebellar infarct. Electronically Signed   By: Sebastian Ache M.D.   On: 03/21/2021 10:00    EKG: Independently reviewed.  Sinus rhythm at 83 bpm with QTC 512.  With new RBBB and LPFB  Assessment/Plan Dysarthria and right-sided weakness: Acute.  Patient reports that symptoms started yesterday morning when he woke up with slurred speech, right-sided facial droop, and weakness in his right hand.  CT scan of the brain showed no acute abnormalities.  Suspect patient has likely had a stroke. -Admit to telemetry bed -Stroke order set initiated -Neuro checks -Check Hemoglobin A1c and lipid panel -Check  MRI brain -Check echocardiogram -PT/OT/speech to eval and treat -Discussed with neurology who recommended formally consulting if MRI positive.  Leukocytosis: Acute.  WBC elevated 10.7.  -Recheck CBC in a.m.  Abnormal Urinalysis: Present on admission.  Urinalysis was positive for many bacteria, protein, 6-10 WBCs, and 11 -20 WBCs. Patient denied any urinary complaints. 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  Patient had mechanical heart valve placed in 2021. -Check PT/INR daily -Coumadin per pharmacy  Essential  hypertension: Home blood pressure medications include metoprolol 100 mg daily lisinopril-hydrochlorothiazide 20-12.5 mg once daily. -Held home blood pressure medications.  Hypocalcemia: Acute.  Calcium mildly low at 8.8. -Give calcium gluconate IV x1 dose -Continue to monitor and replace as needed  Diabetes mellitus type 2, uncontrolled: Last available hemoglobin A1c was 8 on 11/27/2019.  Blood sugars in the hospital had just been mildly elevated at 167.  Home medication regimen included metformin 5 mg twice daily and Amaryl 4 mg daily with breakfast. -Hypoglycemic protocols -Follow-up hemoglobin A1c -Hold Amaryl and metformin -CBGs before every meal and at bedtime with sensitive SSI -Adjust insulin regimen as needed  Obesity: BMI 30.89 kg/m  DVT prophylaxis: Coumadin Code Status: Full Family Communication: wife Disposition Plan: To be determined Consults  called: Neurology  Admission status: Inpatient   Clydie Braunondell A Kelten Enochs MD [facility]   If 7PM-7AM, please contact night-coverage   03/21/2021, 3:32 PM

## 2021-03-21 NOTE — ED Triage Notes (Signed)
Pt c/o slurred speech & right sided facial droop  with drooling since yesterday morning approximately 8-9am. States feels generally weak. Denies falls. States feels "clumsy".

## 2021-03-21 NOTE — ED Notes (Signed)
rec RN at [facility] not available, phone number provided for this RN, will call back to obtain report

## 2021-03-21 NOTE — ED Notes (Signed)
[facility] Transport Team at bedside 

## 2021-03-21 NOTE — ED Notes (Signed)
PT IS NPO UNTIL FURTHER NOTICE

## 2021-03-21 NOTE — ED Notes (Signed)
Resting quietly, voices no complaints at this time, cont to await for room assignment. Neuro status unchanged, rt side mouth droop still noted and speech remains slightly slurred, pt is very alert and oriented and follows commands without hesitation. PEERLA . Remains in NSR on monitor. Resp even and non-labored

## 2021-03-21 NOTE — ED Notes (Signed)
ED Provider at back at bedside

## 2021-03-21 NOTE — ED Notes (Signed)
Cardiac Assessment: loud "click" sound at Rt of Sternal Border, 2nd IC, pt states he has a AV valve replacement

## 2021-03-21 NOTE — ED Notes (Signed)
Again per pt and wife statement, pt was last normal yesterday 0800hrs

## 2021-03-21 NOTE — ED Notes (Signed)
PHONE HANDOFF REPORT PROVIDED TO REC RN AT MAIN CAMPUS, [place] UNIT [address]

## 2021-03-21 NOTE — ED Notes (Addendum)
Joseph Padilla - [phone] Wife taking pt meds and pt shoes, t shirt, plaid shirt and ball cap

## 2021-03-21 NOTE — ED Notes (Signed)
Pt belongings, pt has on white metal necklace, glasses and pants

## 2021-03-21 NOTE — ED Notes (Signed)
ED MD in room, pt was immediately brought back to exam room 9 upon arrival to ED. VS, ECG, CBG, Neuro assessment, ABC assessment immediately performed

## 2021-03-21 NOTE — ED Notes (Signed)
PHONE HANDOFF REPORT PROVIDED TO [facility]SPORT TEAM (DAVID)

## 2021-03-21 NOTE — ED Notes (Signed)
Pt able to drink majority of water, had one sip left and started choking. Failed swallow screen

## 2021-03-22 ENCOUNTER — Inpatient Hospital Stay (HOSPITAL_COMMUNITY): Payer: Medicare Other

## 2021-03-22 DIAGNOSIS — E1169 Type 2 diabetes mellitus with other specified complication: Secondary | ICD-10-CM

## 2021-03-22 DIAGNOSIS — I639 Cerebral infarction, unspecified: Secondary | ICD-10-CM

## 2021-03-22 DIAGNOSIS — E669 Obesity, unspecified: Secondary | ICD-10-CM

## 2021-03-22 DIAGNOSIS — I6389 Other cerebral infarction: Secondary | ICD-10-CM

## 2021-03-22 DIAGNOSIS — Z952 Presence of prosthetic heart valve: Secondary | ICD-10-CM

## 2021-03-22 LAB — PHOSPHORUS: Phosphorus: 3.3 mg/dL (ref 2.5–4.6)

## 2021-03-22 LAB — MAGNESIUM: Magnesium: 1.4 mg/dL — ABNORMAL LOW (ref 1.7–2.4)

## 2021-03-22 LAB — ECHOCARDIOGRAM COMPLETE
AR max vel: 0.63 cm2
AV Area VTI: 0.65 cm2
AV Area mean vel: 0.61 cm2
AV Mean grad: 24 mmHg
AV Peak grad: 39.7 mmHg
Ao pk vel: 3.15 m/s
Area-P 1/2: 5.02 cm2
Height: 71 in
S' Lateral: 3.7 cm
Weight: 3544 oz

## 2021-03-22 LAB — CBC
HCT: 43.5 % (ref 39.0–52.0)
Hemoglobin: 15.1 g/dL (ref 13.0–17.0)
MCH: 30.9 pg (ref 26.0–34.0)
MCHC: 34.7 g/dL (ref 30.0–36.0)
MCV: 89 fL (ref 80.0–100.0)
Platelets: 210 10*3/uL (ref 150–400)
RBC: 4.89 MIL/uL (ref 4.22–5.81)
RDW: 13 % (ref 11.5–15.5)
WBC: 8.8 10*3/uL (ref 4.0–10.5)
nRBC: 0 % (ref 0.0–0.2)

## 2021-03-22 LAB — URINALYSIS, ROUTINE W REFLEX MICROSCOPIC
Bilirubin Urine: NEGATIVE
Glucose, UA: NEGATIVE mg/dL
Hgb urine dipstick: NEGATIVE
Ketones, ur: 80 mg/dL — AB
Leukocytes,Ua: NEGATIVE
Nitrite: NEGATIVE
Protein, ur: NEGATIVE mg/dL
Specific Gravity, Urine: 1.027 (ref 1.005–1.030)
pH: 7 (ref 5.0–8.0)

## 2021-03-22 LAB — BASIC METABOLIC PANEL
Anion gap: 9 (ref 5–15)
BUN: 10 mg/dL (ref 8–23)
CO2: 25 mmol/L (ref 22–32)
Calcium: 9 mg/dL (ref 8.9–10.3)
Chloride: 103 mmol/L (ref 98–111)
Creatinine, Ser: 1 mg/dL (ref 0.61–1.24)
GFR, Estimated: >60 mL/min (ref >60)
Glucose, Bld: 120 mg/dL — ABNORMAL HIGH (ref 70–99)
Potassium: 3.3 mmol/L — ABNORMAL LOW (ref 3.5–5.1)
Sodium: 137 mmol/L (ref 135–145)

## 2021-03-22 LAB — GLUCOSE, CAPILLARY
Glucose-Capillary: 200 mg/dL — ABNORMAL HIGH (ref 70–99)
Glucose-Capillary: 258 mg/dL — ABNORMAL HIGH (ref 70–99)
Glucose-Capillary: 259 mg/dL — ABNORMAL HIGH (ref 70–99)
Glucose-Capillary: 185 mg/dL — ABNORMAL HIGH (ref 70–99)
Glucose-Capillary: 191 mg/dL — ABNORMAL HIGH (ref 70–99)

## 2021-03-22 LAB — URINALYSIS, MICROSCOPIC (REFLEX): RBC / HPF: >50 RBC/hpf (ref 0–5)

## 2021-03-22 LAB — PROTIME-INR
INR: 4.6 (ref 0.8–1.2)
Prothrombin Time: 43.5 seconds — ABNORMAL HIGH (ref 11.4–15.2)

## 2021-03-22 MED ORDER — POTASSIUM CHLORIDE CRYS ER 20 MEQ PO TBCR
40.0000 meq | EXTENDED_RELEASE_TABLET | Freq: Once | ORAL | Status: AC
  Administered 2021-03-22: 40 meq via ORAL
  Filled 2021-03-22: qty 2

## 2021-03-22 MED ORDER — MAGNESIUM SULFATE 2 GM/50ML IV SOLN
2.0000 g | Freq: Once | INTRAVENOUS | Status: AC
  Administered 2021-03-22: 2 g via INTRAVENOUS
  Filled 2021-03-22: qty 50

## 2021-03-22 NOTE — Progress Notes (Signed)
Physical Therapy Evaluation Patient Details Name: Joseph Padilla MRN: [id number] DOB: [birth year] Today's Date: 03/22/2021   History of Present Illness  80 y.o. male presenting to ED 6/26 with slurred speech and R-sided facial droop. CT (-) acute abnormality. MRI (+) acute ischemic nonhemorrhagic L basal ganglia/corona radiata infarct, moderate chronic  microvascular ischemic disease and multiple remote lacunar  infarcts involving the hemispheric cerebral Joseph Padilla matter, basal  ganglia, thalami, and cerebellum. PMHx significant for HTN, HLD, DMII, and mechanical aortic valve replacement.  Clinical Impression  Pt admitted with above diagnosis. Pt was able to ambulate in room with and without device however pt is steadier with RW and pt encouraged to use RW initially at home. Should progress well. Will follow acutely.  Pt currently with functional limitations due to the deficits listed below (see PT Problem List). Pt will benefit from skilled PT to increase their independence and safety with mobility to allow discharge to the venue listed below.       Follow Up Recommendations Home health PT    Equipment Recommendations  Rolling walker with 5" wheels (unless pt does borrow daughter or wifes RW)    Recommendations for Other Services       Precautions / Restrictions Precautions Precautions: Fall Restrictions Weight Bearing Restrictions: No      Mobility  Bed Mobility Overal bed mobility: Needs Assistance Bed Mobility: Supine to Sit     Supine to sit: Supervision     General bed mobility comments: Supervision A for safety.    Transfers Overall transfer level: Needs assistance Equipment used: None;Rolling walker (2 wheeled) Transfers: Sit to/from Stand Sit to Stand: Min guard         General transfer comment: Min guard for steadying/balance.  Ambulation/Gait Ambulation/Gait assistance: Min guard Gait Distance (Feet): 200 Feet (100 feet with RW and 100 feet without  device) Assistive device: Rolling walker (2 wheeled);None Gait Pattern/deviations: Step-through pattern;Decreased stride length;Drifts right/left   Gait velocity interpretation: <1.31 ft/sec, indicative of household ambulator General Gait Details: Pt was able to ambulate with RW  in room with no LOB. Pt needed a few cues to stay close to RW however once cued, pt did well with RW.  Pt a little more unsteady when not using RW as he did stagger but could self correct.  Pt encouraged to use the RW at home and pt agrees.  Stairs            Wheelchair Mobility    Modified Rankin (Stroke Patients Only) Modified Rankin (Stroke Patients Only) Pre-Morbid Rankin Score: No symptoms Modified Rankin: Moderately severe disability     Balance Overall balance assessment: Needs assistance Sitting-balance support: No upper extremity supported;Feet supported Sitting balance-Leahy Scale: Good     Standing balance support: Single extremity supported;No upper extremity supported;During functional activity Standing balance-Leahy Scale: Fair Standing balance comment: Able to maintain static standing balance without UE support. Reliant on at least unilateral UE support with dynamic tasks.                             Pertinent Vitals/Pain Pain Assessment: No/denies pain    Home Living Family/patient expects to be discharged to:: Private residence Living Arrangements: Spouse/significant other Available Help at Discharge: Family Type of Home: House Home Access: Stairs to enter Entrance Stairs-Rails: Right Entrance Stairs-Number of Steps: 8 Home Layout: One level;Laundry or work area in [employer] Equipment: Environmental consultant - 2 wheels (wife has a walker she  doesnt use)      Prior Function Level of Independence: Independent         Comments: I with ADLs/IADLs; drives; shared responsibility with wife for cooking/cleaning     Hand Dominance   Dominant Hand: Right     Extremity/Trunk Assessment   Upper Extremity Assessment Upper Extremity Assessment: Defer to OT evaluation    Lower Extremity Assessment Lower Extremity Assessment: Generalized weakness    Cervical / Trunk Assessment Cervical / Trunk Assessment: Normal  Communication   Communication: Other (comment) (Dysarthric)  Cognition Arousal/Alertness: Awake/alert Behavior During Therapy: WFL for tasks assessed/performed Overall Cognitive Status: Within Functional Limits for tasks assessed                                        General Comments General comments (skin integrity, edema, etc.): VSS    Exercises     Assessment/Plan    PT Assessment Patient needs continued PT services  PT Problem List Decreased mobility;Decreased balance;Decreased activity tolerance;Decreased knowledge of use of DME;Decreased safety awareness;Decreased knowledge of precautions       PT Treatment Interventions DME instruction;Gait training;Functional mobility training;Therapeutic activities;Therapeutic exercise;Balance training;Patient/family education;Stair training    PT Goals (Current goals can be found in the Care Plan section)  Acute Rehab PT Goals Patient Stated Goal: To return home. PT Goal Formulation: With patient Time For Goal Achievement: 04/05/21 Potential to Achieve Goals: Good    Frequency Min 3X/week   Barriers to discharge        Co-evaluation               AM-PAC PT "6 Clicks" Mobility  Outcome Measure Help needed turning from your back to your side while in a flat bed without using bedrails?: None Help needed moving from lying on your back to sitting on the side of a flat bed without using bedrails?: None Help needed moving to and from a bed to a chair (including a wheelchair)?: A Little Help needed standing up from a chair using your arms (e.g., wheelchair or bedside chair)?: A Little Help needed to walk in hospital room?: A Little Help needed climbing  3-5 steps with a railing? : A Little 6 Click Score: 20    End of Session Equipment Utilized During Treatment: Gait belt Activity Tolerance: Patient tolerated treatment well Patient left: with call bell/phone within reach;in bed Nurse Communication: Mobility status PT Visit Diagnosis: Muscle weakness (generalized) (M62.81)    Time: 0272-5366 PT Time Calculation (min) (ACUTE ONLY): 14 min   Charges:   PT Evaluation $PT Eval Moderate Complexity: 1 Mod          Joseph Padilla,PT Acute Rehab Services [phone] (pager)   Bevelyn Buckles 03/22/2021, 3:51 PM

## 2021-03-22 NOTE — Evaluation (Signed)
Occupational Therapy Evaluation Patient Details Name: Joseph Padilla MRN: [id number] DOB: [birth year] Today's Date: 03/22/2021    History of Present Illness 80 y.o. male presenting to ED 6/26 with slurred speech and R-sided facial droop. CT (-) acute abnormality. 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 PMHx significant for HTN, HLD, DMII, and mechanical aortic valve replacement.   Clinical Impression   PTA patient was living with his wife in a private residence with 8STE and R rail and was grossly I with ADLs/IADLs without AD. Patient reports shared responsibility with wife for IADLs including driving, laundry, and meal prep. Patient states that his wife has beginning stages of dementia without an official diagnosis. Patient currently functioning below baseline demonstrating observed ADLs including grooming standing at sink level and LB dressing with Min guard without AD. Patient also limited by deficits listed below including mild R hemiparesis, balance deficits, and mildly decreased coordination and would benefit from continued acute OT services in prep for safe d/c home with HHOT.     Follow Up Recommendations  Home health OT    Equipment Recommendations  None recommended by OT    Recommendations for Other Services       Precautions / Restrictions Precautions Precautions: Fall Restrictions Weight Bearing Restrictions: No      Mobility Bed Mobility Overal bed mobility: Needs Assistance Bed Mobility: Supine to Sit     Supine to sit: Supervision     General bed mobility comments: Supervision A for safety.    Transfers Overall transfer level: Needs assistance Equipment used: None Transfers: Sit to/from Stand Sit to Stand: Min guard         General transfer  comment: Min guard for steadying/balance.    Balance Overall balance assessment: Needs assistance Sitting-balance support: No upper extremity supported;Feet supported Sitting balance-Leahy Scale: Good     Standing balance support: Single extremity supported;No upper extremity supported;During functional activity Standing balance-Leahy Scale: Fair Standing balance comment: Able to maintain static standing balance without UE support. Reliant on at least unilateral UE support with dynamic tasks.                           ADL either performed or assessed with clinical judgement   ADL Overall ADL's : Needs assistance/impaired Eating/Feeding: Independent Eating/Feeding Details (indicate cue type and reason): Reports mild difficulty manipulating utensils. Grooming: Wash/dry hands;Standing;Min guard Grooming Details (indicate cue type and reason): Hand washing standing at sink level with Min guard for steadying/safety.             Lower Body Dressing: Min guard;Sit to/from stand Lower Body Dressing Details (indicate cue type and reason): Able to doff/don footwear seated EOB without external assist. Slightly increased time required 2/2 coordination deficits in RUE. Toilet Transfer: Hydrographic surveyor Details (indicate cue type and reason): Simulated with transfer to recliner with Min guard. No AD.         Functional mobility during ADLs: Min guard General ADL Comments: Patient limited by RUE weakness, decreased coordination and decreased dynamic standing balance.     Vision Baseline Vision/History: Wears glasses Wears Glasses: Reading only Patient Visual Report: No change from baseline Vision Assessment?: No apparent visual deficits     Perception     Praxis      Pertinent Vitals/Pain Pain Assessment:  No/denies pain     Hand Dominance Right   Extremity/Trunk Assessment Upper Extremity Assessment Upper Extremity Assessment: RUE deficits/detail RUE  Deficits / Details: AROM WFL (although slow). MMT grossly 4-/5. RUE Sensation: WNL RUE Coordination: decreased fine motor (mild)   Lower Extremity Assessment Lower Extremity Assessment: Defer to PT evaluation       Communication Communication Communication: Other (comment) (Dysarthric)   Cognition Arousal/Alertness: Awake/alert Behavior During Therapy: WFL for tasks assessed/performed Overall Cognitive Status: Within Functional Limits for tasks assessed                                     General Comments       Exercises     Shoulder Instructions      Home Living Family/patient expects to be discharged to:: Private residence Living Arrangements: Spouse/significant other Available Help at Discharge: Family Type of Home: House Home Access: Stairs to enter [employer] of Steps: 8 Entrance Stairs-Rails: Right Home Layout: One level;Laundry or work area in basement     [employer] Shower/Tub: Chief Strategy Officer: Handicapped height     Home Equipment: None          Prior Functioning/Environment Level of Independence: Independent        Comments: I with ADLs/IADLs; drives; shared responsibility with wife for cooking/cleaning        OT Problem List: Decreased strength;Impaired balance (sitting and/or standing);Decreased coordination;Impaired UE functional use      OT Treatment/Interventions: Self-care/ADL training;Therapeutic exercise;Neuromuscular education;Energy conservation;DME and/or AE instruction;Therapeutic activities;Patient/family education;Balance training    OT Goals(Current goals can be found in the care plan section) Acute Rehab OT Goals Patient Stated Goal: To return home. OT Goal Formulation: With patient Time For Goal Achievement: 04/05/21 Potential to Achieve Goals: Good ADL Goals Additional ADL Goal #1: Patient will complete ADLs with Mod I in prep for safe return to PLOF. Additional ADL Goal #2:  Patient will return demo RUE NMR HEP with I and use of written handout.  OT Frequency: Min 2X/week   Barriers to D/C:            Co-evaluation              AM-PAC OT "6 Clicks" Daily Activity     Outcome Measure Help from another person eating meals?: None Help from another person taking care of personal grooming?: A Little Help from another person toileting, which includes using toliet, bedpan, or urinal?: A Little Help from another person bathing (including washing, rinsing, drying)?: A Little Help from another person to put on and taking off regular upper body clothing?: A Little Help from another person to put on and taking off regular lower body clothing?: A Little 6 Click Score: 19   End of Session Equipment Utilized During Treatment: Gait belt Nurse Communication: Mobility status  Activity Tolerance: Patient tolerated treatment well Patient left: in chair;with call bell/phone within reach  OT Visit Diagnosis: Unsteadiness on feet (R26.81);Muscle weakness (generalized) (M62.81);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 OTR/L Supplemental OT, Department of rehab services [phone]  Addilee Neu R H.  03/22/2021, 10:02 AM

## 2021-03-22 NOTE — Progress Notes (Signed)
  Echocardiogram 2D Echocardiogram has been performed.  Joseph Padilla 03/22/2021, 12:36 PM

## 2021-03-22 NOTE — Progress Notes (Signed)
ANTICOAGULATION CONSULT NOTE Pharmacy Consult for warfarin Indication: atrial fibrillation and mechanical aortic valve  No Known Allergies  Patient Measurements: Height: 5\' 11"  (180.3 cm) Weight: 100.5 kg (221 lb 8 oz) IBW/kg (Calculated) : 75.3   Vital Signs: Temp: 98 F (36.7 C) (06/27 0735) Temp Source: Oral (06/27 0735) BP: 163/91 (06/27 0735) Pulse Rate: 96 (06/27 0735)  Labs: Recent Labs    03/21/21 0905 03/22/21 0232  HGB 16.4 15.1  HCT 48.4 43.5  PLT 252 210  APTT 46*  --   LABPROT 34.3* 43.5*  INR 3.4* 4.6*  CREATININE 1.02 1.00     Estimated Creatinine Clearance: 73.5 mL/min (by C-G formula based on SCr of 1 mg/dL).  Assessment: 79 year old male admitted with slurred speech and facial droop transferred from [facility]. Patient with history of chronic anticoagulation with warfarin due to afib and mechanical aortic valve. INR on admission is within range at 3.4.   INR elevated today at 4.6  Goal of Therapy:  INR 2.5-3.5 Monitor platelets by anticoagulation protocol: Yes   Plan:  Hold warfarin today Daily INR  Thank you [facility], PharmD  03/22/2021 8:35 AM

## 2021-03-22 NOTE — Progress Notes (Signed)
Carotid artery duplex has been completed. Preliminary results can be found in CV Proc through chart review.   03/22/21 2:36 PM Olen Cordial RVT

## 2021-03-22 NOTE — Consult Note (Signed)
Neurology Consultation  Reason for Consult: MRI brain with a 1.6 cm acute ischemic nonhemorrhagic left basal ganglia/corona radiata infarct. Referring Physician: Dr. Jerral Ralph  CC: Slurred speech and right-sided mouth droop  History is obtained from: Chart review, patient  HPI: Joseph Padilla is a 80 y.o. male with a medical history significant for mechanical aortic valve in 2001 on chronic coumadin therapy, hypertension, hyperlipidemia, type II diabetes mellitus, and remote tobacco use who presented to [facility] on 6/26 for evaluation of one day of dysarthria and right-sided facial drooping. Joseph Padilla states that when he went to sleep Friday night, March 19, 2021 he was in his normal state of health. When he woke up on Saturday, June 25 he noticed that he was drooling down his clothes from his right mouth, with right mouth  drooping, and with slurred speech but he did was not initially concerned about stroke since he was still able to move his mouth some. He decided to go to the hospital Sunday, June 26th when his deficits had persisted.   At baseline, Joseph Padilla lives at home with his wife, walks independently without assistive device, and manages his own medications without difficulty. He denies any recent loss of coordination with gait, falls, or lower extremity weakness.   LKW: 03/20/2021  Modified Rankin Scale: 1-No significant post stroke disability and can perform usual duties with stroke symptoms  ROS: A complete ROS was performed and is negative except as noted in the HPI.    Past Medical History:  Diagnosis Date   Diabetes mellitus type 2 in obese (HCC)    History of mechanical aortic valve replacement    valve    Hypertension    Past Surgical History:  Procedure Laterality Date   APPENDECTOMY     BACK SURGERY     VALVE REPLACEMENT     Family History:  Mother: Hypertension Paternal Grandfather: stroke  Social History:   reports that he has never smoked. He has  never used smokeless tobacco. He reports that he does not drink alcohol and does not use drugs.  Medications  Current Facility-Administered Medications:    acetaminophen (TYLENOL) tablet 650 mg, 650 mg, Oral, Q4H PRN, 650 mg at 03/22/21 0745 **OR** acetaminophen (TYLENOL) 160 MG/5ML solution 650 mg, 650 mg, Per Tube, Q4H PRN **OR** acetaminophen (TYLENOL) suppository 650 mg, 650 mg, Rectal, Q4H PRN, Katrinka Blazing, Rondell A, MD   ezetimibe (ZETIA) tablet 10 mg, 10 mg, Oral, Daily, Smith, Rondell A, MD, 10 mg at 03/22/21 0745   insulin aspart (novoLOG) injection 0-5 Units, 0-5 Units, Subcutaneous, QHS, Smith, Rondell A, MD   insulin aspart (novoLOG) injection 0-9 Units, 0-9 Units, Subcutaneous, TID WC, Smith, Rondell A, MD, 2 Units at 03/22/21 0744   rosuvastatin (CRESTOR) tablet 20 mg, 20 mg, Oral, Daily, Katrinka Blazing, Rondell A, MD, 20 mg at 03/22/21 0745   senna-docusate (Senokot-S) tablet 1 tablet, 1 tablet, Oral, QHS PRN, Clydie Braun, MD   Warfarin - Pharmacist Dosing Inpatient, , Does not apply, q1600, Earnie Larsson, RPH  Exam: Current vital signs: BP (!) 163/91 (BP Location: Left Arm)   Pulse 96   Temp 98 F (36.7 C) (Oral)   Resp 20   Ht 5\' 11"  (1.803 m)   Wt 100.5 kg   SpO2 95%   BMI 30.89 kg/m  Vital signs in last 24 hours: Temp:  [97.8 F (36.6 C)-98.3 F (36.8 C)] 98 F (36.7 C) (06/27 0735) Pulse Rate:  [80-97] 96 (06/27 0735) Resp:  [  16-24] 20 (06/27 0735) BP: (123-188)/(71-94) 163/91 (06/27 0735) SpO2:  [94 %-99 %] 95 % (06/27 0735) Weight:  [100.5 kg] 100.5 kg (06/26 0859)  GENERAL: Awake, alert, sitting up in bed with breakfast tray, in no acute distress Psych: Affect appropriate for situation, he is calm and cooperative throughout examination Head: Normocephalic and atraumatic EENT: Poor dentition, normal conjunctivae, no OP obstruction, dry mucous membranes LUNGS: Normal respiratory effort on room air, SpO2 mid-90's on cardiac monitor CV: Regular rate on tele,  extremities well perfused without edema ABDOMEN - Soft, non-tender, non-distended Ext: warm, well perfused, no obvious deformity  NEURO:  Mental Status: Awake, alert, and oriented to self, age, month, year, place, and situation. He is able to provide a clear and coherent history of present illness.  Speech/Language: speech is dysarthric without aphasia.   Naming, repetition, fluency, and comprehension are intact. No signs of neglect noted.  Cranial Nerves:  II: PERRL 4 mm/brisk. Visual fields full. III, IV, VI: EOMI with saccadic pursuits. No ptosis or nystagmus noted.  V: Sensation is intact to light touch and symmetrical to face.  VII: Face is asymmetric with right mouth droop and less elevation of the right mouth with smiling. VIII: Hearing is intact to voice IX, X: Palate elevation is symmetric. Phonation normal.  XI: Normal sternocleidomastoid and trapezius muscle strength XII: Tongue protrudes midline without fasciculations.   Motor: 5/5 strength present in bilateral upper extremities: grips, biceps, and triceps without pronator drift on assessment. Patient with reports of some subjective right upper extremity weakness from baseline with minimal deltoid asymmetry noted: right deltoid strength 4+/5 with left deltoid 5/5 strength.  Bilateral lower extremities with 5/5 strength without vertical drift on assessment.  Tone is normal. Bulk is normal.  Sensation: Intact to light touch bilaterally in all four extremities. No extinction to DSS present.  Coordination: FTN intact bilaterally. HKS intact bilaterally.  DTRs: 2+ and symmetric biceps and patellae.  Plantars: Toes downgoing bilaterally Gait: deferred  NIHSS: 1a Level of Conscious.: 0 1b LOC Questions: 0 1c LOC Commands: 0 2 Best Gaze: 0 3 Visual: 0 4 Facial Palsy: 1 5a Motor Arm - left: 0 5b Motor Arm - Right: 0 6a Motor Leg - Left: 0 6b Motor Leg - Right: 0 7 Limb Ataxia: 0 8 Sensory: 0 9 Best Language: 0 10  Dysarthria: 1 11 Extinct. and Inatten.: 0 TOTAL: 2  Labs I have reviewed labs in epic and the results pertinent to this consultation are: CBC    Component Value Date/Time   WBC 8.8 03/22/2021 0232   RBC 4.89 03/22/2021 0232   HGB 15.1 03/22/2021 0232   HCT 43.5 03/22/2021 0232   PLT 210 03/22/2021 0232   MCV 89.0 03/22/2021 0232   MCH 30.9 03/22/2021 0232   MCHC 34.7 03/22/2021 0232   RDW 13.0 03/22/2021 0232   LYMPHSABS 1.0 03/21/2021 0905   MONOABS 0.8 03/21/2021 0905   EOSABS 0.1 03/21/2021 0905   BASOSABS 0.1 03/21/2021 0905   CMP     Component Value Date/Time   NA 137 03/22/2021 0232   K 3.3 (L) 03/22/2021 0232   CL 103 03/22/2021 0232   CO2 25 03/22/2021 0232   GLUCOSE 120 (H) 03/22/2021 0232   BUN 10 03/22/2021 0232   CREATININE 1.00 03/22/2021 0232   CALCIUM 9.0 03/22/2021 0232   PROT 7.0 03/21/2021 0905   ALBUMIN 4.2 03/21/2021 0905   AST 28 03/21/2021 0905   ALT 25 03/21/2021 0905   ALKPHOS 70 03/21/2021  0905   BILITOT 1.0 03/21/2021 0905   GFRNONAA >60 03/22/2021 0232   GFRAA 62 (L) 11/02/2012 1357   Lipid Panel     Component Value Date/Time   CHOL 108 03/21/2021 1631   TRIG 98 03/21/2021 1631   HDL 30 (L) 03/21/2021 1631   CHOLHDL 3.6 03/21/2021 1631   VLDL 20 03/21/2021 1631   LDLCALC 58 03/21/2021 1631   No results found for: HGBA1C Lab Results  Component Value Date   INR 4.6 (HH) 03/22/2021   INR 3.4 (H) 03/21/2021   INR 2.98 (H) 11/02/2012   Imaging I have reviewed the images obtained:  CT-scan of the brain 03/21/2021: 1. No evidence of an acute large territory infarct or intracranial hemorrhage. 2. Moderate chronic small vessel ischemic disease with age indeterminate lacunar infarcts in the basal ganglia/deep white matter bilaterally. 3. Small chronic left cerebellar infarct.  MRI examination of the brain 03/21/2021: 1. 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 2. Underlying age-related cerebral  atrophy with moderate chronic microvascular ischemic disease, with multiple remote lacunar infarcts involving the hemispheric cerebral white matter, basal ganglia, thalami, and cerebellum.  Assessment: 80 year old man with PMHx of HTN, HLD, type 2 DM, remote tobacco use, and mechanical aortic valve since 2001 on chronic coumadin therapy who presented to [facility] as a transfer from [facility] for evaluation of one day of dysarthria and right mouth droop and was found to have a 1.6 cm acute ischemic nonhemorrhagic left basal ganglia/corona radiata infarct on MRI brain imaging.  - Examination reveals patient with persistent dysarthria and right-sided mouth asymmetry.  - Stroke risk factors include patient's age, remote infarcts identified on MRI, remote tobacco smoking, and PMHx significant for HTN, HLD, type 2 DM, and mechanical aortic valve since 2001- on chronic coumadin therapy with INR goal 2.5 - 2.5.  - Patient on chronic anticoagulation with coumadin for a mechanical aortic valve since 2001 with an INR goal of 2.5 - 3.5 and supratherapeutic INR during admission. Though there is risk for hemorrhagic transformation of acute infarct, the risk of hemorrhage is felt to be of less concern than the risk of complications associated with stopping anticoagulation with new acute infarct on imaging despite supratherapeutic INR. Pharmacy on board for coumadin dosing for goal INR of 2.5 - 3.5 for chronic mechanical aortic valve.  - Etiology of right facial asymmetry and dysarthria consistent with MRI finding of acute left basal ganglia infarct. Infarct etiology likely atherosclerotic versus cardioembolic. Full stroke work up pending.   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  - Risk factor modification - Telemetry monitoring - PT consult, OT consult, Speech consult - Stroke team to follow  Pt seen by NP/Neuro and later by MD. Note/plan to be edited by MD as needed.  Lanae Boast, AGAC-NP [facility] Pager: [phone]  Attending addendum Patient seen and examined. 80 year old with past history of  mechanical aortic valve on chronic Coumadin supratherapeutic, hypertension, hyperlipidemia, diabetes presenting to [facility] emergency room 03/21/2021 for greater than 24 hours worth of dysarthria and right-sided facial drooping.  Last known well was somewhere around Friday night-March 19, 2021 when he was in her normal state of health and woke up on Saturday, June 25 with slurred speech. Does not note considerable weakness but says that once he has been working with doctors at the hospital, everyone has commented on slight right arm weakness in comparison to the left. At baseline, has normal speech. Review of systems confirmed as above. Past medical history as documented in the chart above On my examination, he has moderate dysarthria, subtle right facial weakness as well as subtle right deltoid weakness without vertical drift.  NIH stroke scale 2. MRI of the brain personally reviewed-shows acute left basal ganglia/corona radiata infarct. Recommendations as above Ideally, would have not kept him on anticoagulation for a few days but given the fact that he has a mechanical aortic valve, the benefits of anticoagulation outweigh the small risk of hemorrhagic  transformation, which we will unfortunately have to take at this time. Stroke team will follow with you. Plan discussed with Dr Jerral Ralph on the phone. -- Milon Dikes, MD Neurologist [facility] Pager: [phone]

## 2021-03-22 NOTE — Progress Notes (Signed)
[facility] night shift telemetry coverage note.  The nursing staff communicated to me that today's morning labs show an INR of 4.6.  He is on warfarin due to the presence of a mechanical valve. His urine collecting bag shows pink-colored urine.  CBC was normal with an H&H of 15.1/43.5.  An urinalysis was ordered. Potassium was 3.3 mmol/L.Marland Kitchen Potassium supplementation ordered.  Magnesium and phosphorus level have been added to the morning labs.  Sanda Klein, MD.

## 2021-03-22 NOTE — Progress Notes (Signed)
Lab called to report critical INR value of 4.6 at 0333am. Dr. Robb Matar on-call paged and notified of INR results as we all pt urine which was assessed to to be slightly bloody in external drainage bag. Pt in bed with call light within reach. Dionne Bucy RN

## 2021-03-22 NOTE — Progress Notes (Signed)
PROGRESS NOTE        PATIENT DETAILS Name: Joseph Padilla Age: 80 y.o. Sex: male Date of Birth: 1940-11-01 Admit Date: 03/21/2021 Admitting Physician Clydie Braun, MD [id number], Isidor Holts., PA-C  Brief Narrative: Patient is a 80 y.o. 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  See below for further details.  Significant events: 6/26>> admit for slurred speech-found to have acute CVA.  Significant studies: 6/26>> MRI brain: 1.6 cm acute infarct in left basal ganglia/corona radiata 6/26>> LDL: 58  Antimicrobial therapy: None  Microbiology data: 6/26>> COVID/influenza PCR: Negative  Procedures : None  Consults: Neurology  DVT Prophylaxis : Coumadin   Subjective: Continues to have some slurred speech.  No other events overnight.  Assessment/Plan: Acute CVA: Continues to have dysarthria-work-up in progress-neurology consulted.  Await further recommendations.  History of mechanical aortic valve replacement: INR supratherapeutic-Coumadin on hold-pharmacy following.  No evidence of bleeding.  HTN: Allow permissive hypertension in the setting of acute CVA-resume antihypertensives when able.  HLD: On Crestor and Zetia  DM-2: A1c pending-CBG slightly on the higher side-for now continue with SSI-oral hypoglycemic agents remain on hold.  Reassess 6/28.    Recent Labs    03/22/21 0730 03/22/21 1100 03/22/21 1137  GLUCAP 200* 258* 259*    Hypokalemia: Replete and recheck  Hypomagnesemia: Replete and recheck.  Microscopic hematuria: Probably due to Coumadin-however will need outpatient urology evaluation.  Obesity: Estimated body mass index is 30.89 kg/m as calculated from the following:   Height as of this encounter: 5\' 11"  (1.803 m).   Weight as of this encounter: 100.5 kg.    Diet: Diet Order             Diet heart healthy/carb  modified Room service appropriate? Yes; Fluid consistency: Thin  Diet effective now                    Code Status: Full code   Family Communication: None at bedside  Disposition Plan: Status is: Inpatient  Remains inpatient appropriate because:IV treatments appropriate due to intensity of illness or inability to take PO  Dispo: The patient is from: Home              Anticipated d/c is to: Home              Patient currently is not medically stable to d/c.   Difficult to place patient No    Barriers to Discharge: Acute CVA-work-up in progress.  Antimicrobial agents: Anti-infectives (From admission, onward)    None        Time spent: 35 minutes-Greater than 50% of this time was spent in counseling, explanation of diagnosis, planning of further management, and coordination of care.  MEDICATIONS: Scheduled Meds:  ezetimibe  10 mg Oral Daily   insulin aspart  0-5 Units Subcutaneous QHS   insulin aspart  0-9 Units Subcutaneous TID WC   rosuvastatin  20 mg Oral Daily   Warfarin - Pharmacist Dosing Inpatient   Does not apply q1600   Continuous Infusions: PRN Meds:.acetaminophen **OR** acetaminophen (TYLENOL) oral liquid 160 mg/5 mL **OR** acetaminophen, senna-docusate   PHYSICAL EXAM: Vital signs: Vitals:   03/22/21 0008 03/22/21 0200 03/22/21 0357 03/22/21 0735  BP:  (!) 146/77 (!) 147/80 (!) 163/91  Pulse:  91 97 96  Resp: 19 20 20 20   Temp: 98 F (36.7 C) 97.8 F (36.6 C) 98.3 F (36.8 C) 98 F (36.7 C)  TempSrc: Oral Oral Oral Oral  SpO2:  97% 95% 95%  Weight:      Height:       Filed Weights   03/21/21 0859  Weight: 100.5 kg   Body mass index is 30.89 kg/m.   Gen Exam:Alert awake-not in any distress HEENT:atraumatic, normocephalic Chest: B/L clear to auscultation anteriorly CVS:S1S2 regular Abdomen:soft non tender, non distended Extremities:no edema Neurology: Dysarthric-slight right facial droop. Skin: no rash  I have personally  reviewed following labs and imaging studies  LABORATORY DATA: CBC: Recent Labs  Lab 03/21/21 0905 03/22/21 0232  WBC 10.7* 8.8  NEUTROABS 8.7*  --   HGB 16.4 15.1  HCT 48.4 43.5  MCV 89.8 89.0  PLT 252 210    Basic Metabolic Panel: Recent Labs  Lab 03/21/21 0905 03/22/21 0232  NA 138 137  K 3.6 3.3*  CL 101 103  CO2 26 25  GLUCOSE 154* 120*  BUN 10 10  CREATININE 1.02 1.00  CALCIUM 8.8* 9.0  MG  --  1.4*  PHOS  --  3.3    GFR: Estimated Creatinine Clearance: 73.5 mL/min (by C-G formula based on SCr of 1 mg/dL).  Liver Function Tests: Recent Labs  Lab 03/21/21 0905  AST 28  ALT 25  ALKPHOS 70  BILITOT 1.0  PROT 7.0  ALBUMIN 4.2   No results for input(s): LIPASE, AMYLASE in the last 168 hours. No results for input(s): AMMONIA in the last 168 hours.  Coagulation Profile: Recent Labs  Lab 03/21/21 0905 03/22/21 0232  INR 3.4* 4.6*    Cardiac Enzymes: No results for input(s): CKTOTAL, CKMB, CKMBINDEX, TROPONINI in the last 168 hours.  BNP (last 3 results) No results for input(s): PROBNP in the last 8760 hours.  Lipid Profile: Recent Labs    03/21/21 1631  CHOL 108  HDL 30*  LDLCALC 58  TRIG 98  CHOLHDL 3.6    Thyroid Function Tests: No results for input(s): TSH, T4TOTAL, FREET4, T3FREE, THYROIDAB in the last 72 hours.  Anemia Panel: No results for input(s): VITAMINB12, FOLATE, FERRITIN, TIBC, IRON, RETICCTPCT in the last 72 hours.  Urine analysis:    Component Value Date/Time   COLORURINE RED (A) 03/22/2021 0613   APPEARANCEUR TURBID (A) 03/22/2021 0613   LABSPEC  03/22/2021 0613    TEST NOT REPORTED DUE TO COLOR INTERFERENCE OF URINE PIGMENT   PHURINE  03/22/2021 0613    TEST NOT REPORTED DUE TO COLOR INTERFERENCE OF URINE PIGMENT   GLUCOSEU (A) 03/22/2021 0613    TEST NOT REPORTED DUE TO COLOR INTERFERENCE OF URINE PIGMENT   HGBUR (A) 03/22/2021 0613    TEST NOT REPORTED DUE TO COLOR INTERFERENCE OF URINE PIGMENT   BILIRUBINUR  (A) 03/22/2021 0613    TEST NOT REPORTED DUE TO COLOR INTERFERENCE OF URINE PIGMENT   KETONESUR (A) 03/22/2021 0613    TEST NOT REPORTED DUE TO COLOR INTERFERENCE OF URINE PIGMENT   PROTEINUR (A) 03/22/2021 0613    TEST NOT REPORTED DUE TO COLOR INTERFERENCE OF URINE PIGMENT   UROBILINOGEN 0.2 11/02/2012 1457   NITRITE (A) 03/22/2021 0613    TEST NOT REPORTED DUE TO COLOR INTERFERENCE OF URINE PIGMENT   LEUKOCYTESUR (A) 03/22/2021 0613    TEST NOT REPORTED DUE TO COLOR INTERFERENCE OF URINE PIGMENT    Sepsis Labs: Lactic Acid, Venous  No results found for: LATICACIDVEN  MICROBIOLOGY: Recent Results (from the past 240 hour(s))  Resp Panel by RT-PCR (Flu A&B, Covid) Nasopharyngeal Swab     Status: None   Collection Time: 03/21/21  9:18 AM   Specimen: Nasopharyngeal Swab; Nasopharyngeal(NP) swabs in vial transport medium  Result Value Ref Range Status   SARS Coronavirus 2 by RT PCR NEGATIVE NEGATIVE Final    Comment: (NOTE) SARS-CoV-2 target nucleic acids are NOT DETECTED.  The SARS-CoV-2 RNA is generally detectable in upper respiratory specimens during the acute phase of infection. The lowest concentration of SARS-CoV-2 viral copies this assay can detect is 138 copies/mL. A negative result does not preclude SARS-Cov-2 infection and should not be used as the sole basis for treatment or other patient management decisions. A negative result may occur with  improper specimen collection/handling, submission of specimen other than nasopharyngeal swab, presence of viral mutation(s) within the areas targeted by this assay, and inadequate number of viral copies(<138 copies/mL). A negative result must be combined with clinical observations, patient history, and epidemiological information. The expected result is Negative.  Fact Sheet for Patients:  BloggerCourse.com  Fact Sheet for Healthcare Providers:  SeriousBroker.it  This test is  no t yet approved or cleared by the [place] FDA and  has been authorized for detection and/or diagnosis of SARS-CoV-2 by FDA under an Emergency Use Authorization (EUA). This EUA will remain  in effect (meaning this test can be used) for the duration of the COVID-19 declaration under Section 564(b)(1) of the Act, 21 U.S.C.section 360bbb-3(b)(1), unless the authorization is terminated  or revoked sooner.       Influenza A by PCR NEGATIVE NEGATIVE Final   Influenza B by PCR NEGATIVE NEGATIVE Final    Comment: (NOTE) The Xpert Xpress SARS-CoV-2/FLU/RSV plus assay is intended as an aid in the diagnosis of influenza from Nasopharyngeal swab specimens and should not be used as a sole basis for treatment. Nasal washings and aspirates are unacceptable for Xpert Xpress SARS-CoV-2/FLU/RSV testing.  Fact Sheet for Patients: BloggerCourse.com  Fact Sheet for Healthcare Providers: SeriousBroker.it  This test is not yet approved or cleared by the [place] FDA and has been authorized for detection and/or diagnosis of SARS-CoV-2 by FDA under an Emergency Use Authorization (EUA). This EUA will remain in effect (meaning this test can be used) for the duration of the COVID-19 declaration under Section 564(b)(1) of the Act, 21 U.S.C. section 360bbb-3(b)(1), unless the authorization is terminated or revoked.  Performed at [facility], [address] [place], [place] [address]     RADIOLOGY STUDIES/RESULTS: CT HEAD WO CONTRAST  Result Date: 03/21/2021 CLINICAL DATA:  Slurred speech, right facial droop, and weakness. EXAM: CT HEAD WITHOUT CONTRAST TECHNIQUE: Contiguous axial images were obtained from the base of the skull through the vertex without intravenous contrast. COMPARISON:  None. FINDINGS: Brain: There is no evidence of an acute large territory infarct, intracranial hemorrhage, mass, midline shift, or extra-axial fluid  collection. There is mild cerebral atrophy. Patchy hypodensities in the cerebral white matter bilaterally are nonspecific but compatible with moderate chronic small vessel ischemic disease. There are lacunar infarcts in the basal ganglia/deep white matter bilaterally which are of indeterminate age. A small chronic infarct is noted inferiorly in the left cerebellar hemisphere. Vascular: Calcified atherosclerosis at the skull base. No hyperdense vessel. Skull: No fracture or suspicious osseous lesion. Sinuses/Orbits: Minimal mucosal thickening in the left maxillary sinus. Clear mastoid air cells. Unremarkable orbits. Other: None. IMPRESSION: 1.  No evidence of an acute large territory infarct or intracranial hemorrhage. 2. Moderate chronic small vessel ischemic disease with age indeterminate lacunar infarcts in the basal ganglia/deep white matter bilaterally. 3. Small chronic left cerebellar infarct. Electronically Signed   By: Sebastian AcheAllen  Grady M.D.   On: 03/21/2021 10:00   MR BRAIN WO CONTRAST  Result Date: 03/21/2021 CLINICAL DATA:  Initial evaluation for acute stroke, slurred speech with right-sided facial droop. EXAM: MRI HEAD WITHOUT CONTRAST TECHNIQUE: Multiplanar, multiecho pulse sequences of the brain and surrounding structures were obtained without intravenous contrast. COMPARISON:  Prior CT from earlier the same day. FINDINGS: Brain: Diffuse prominence of the CSF containing spaces compatible generalized age-related cerebral atrophy. Patchy T2/FLAIR hyperintensity within the periventricular and deep white matter both cerebral hemispheres most consistent with chronic small vessel ischemic disease. Multiple scatter remote lacunar infarcts present about the hemispheric cerebral white matter, basal ganglia, thalami, and cerebellum. 1.6 cm acute ischemic nonhemorrhagic infarcts seen involving the left basal ganglia/corona radiata (series 5, image 80). No associated hemorrhage or mass effect. No other evidence for  acute or subacute ischemia. Gray-white matter differentiation otherwise maintained. No acute intracranial hemorrhage. Single chronic microhemorrhage noted at the parasagittal posterior left frontal region, of doubtful significance in isolation. No mass lesion, midline shift or mass effect. No hydrocephalus or extra-axial fluid collection. Pituitary gland suprasellar region normal. Midline structures intact and normal. Vascular: Major intracranial vascular flow voids are maintained. Skull and upper cervical spine: Craniocervical junction within normal limits. Bone marrow signal intensity normal. No scalp soft tissue abnormality. Sinuses/Orbits: Globes and orbital soft tissues demonstrate no acute finding. Paranasal sinuses are largely clear. No mastoid effusion. Inner ear structures grossly normal. Other: None. IMPRESSION: 1. 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 2. Underlying age-related cerebral atrophy with moderate chronic microvascular ischemic disease, with multiple remote lacunar infarcts involving the hemispheric cerebral white matter, basal ganglia, thalami, and cerebellum. Electronically Signed   By: Rise MuBenjamin  McClintock M.D.   On: 03/21/2021 23:17   DG CHEST PORT 1 VIEW  Result Date: 03/21/2021 CLINICAL DATA:  CVA. EXAM: PORTABLE CHEST 1 VIEW COMPARISON:  11/02/2012 FINDINGS: The cardiac silhouette is mildly enlarged with prior aortic valve replacement noted. A rounded calcified nodule projecting over the left mid lung is unchanged. No acute airspace consolidation, edema, sizable pleural effusion, pneumothorax is identified. No acute osseous abnormality is seen. Surgical clips are noted the thoracic inlet. IMPRESSION: No active disease. Electronically Signed   By: Sebastian AcheAllen  Grady M.D.   On: 03/21/2021 17:27     LOS: 1 day   Jeoffrey MassedShanker Cameryn Schum, MD  [facility]    To contact the attending provider between 7A-7P or the covering provider during after  hours 7P-7A, please log into the web site [email or web address] and access using universal [place] password for that web site. If you do not have the password, please call the hospital operator.  03/22/2021, 12:35 PM

## 2021-03-23 DIAGNOSIS — I6330 Cerebral infarction due to thrombosis of unspecified cerebral artery: Secondary | ICD-10-CM

## 2021-03-23 DIAGNOSIS — R8290 Unspecified abnormal findings in urine: Secondary | ICD-10-CM

## 2021-03-23 LAB — HEMOGLOBIN A1C
Hgb A1c MFr Bld: 7.9 % — ABNORMAL HIGH (ref 4.8–5.6)
Mean Plasma Glucose: 180 mg/dL

## 2021-03-23 LAB — BASIC METABOLIC PANEL
Anion gap: 15 (ref 5–15)
BUN: 11 mg/dL (ref 8–23)
CO2: 21 mmol/L — ABNORMAL LOW (ref 22–32)
Calcium: 8.6 mg/dL — ABNORMAL LOW (ref 8.9–10.3)
Chloride: 102 mmol/L (ref 98–111)
Creatinine, Ser: 0.82 mg/dL (ref 0.61–1.24)
GFR, Estimated: >60 mL/min (ref >60)
Glucose, Bld: 195 mg/dL — ABNORMAL HIGH (ref 70–99)
Potassium: 3.5 mmol/L (ref 3.5–5.1)
Sodium: 138 mmol/L (ref 135–145)

## 2021-03-23 LAB — URINE CULTURE: Culture: NO GROWTH

## 2021-03-23 LAB — GLUCOSE, CAPILLARY: Glucose-Capillary: 211 mg/dL — ABNORMAL HIGH (ref 70–99)

## 2021-03-23 LAB — PROTIME-INR
INR: 3.6 — ABNORMAL HIGH (ref 0.8–1.2)
Prothrombin Time: 35.6 seconds — ABNORMAL HIGH (ref 11.4–15.2)

## 2021-03-23 LAB — MAGNESIUM: Magnesium: 1.7 mg/dL (ref 1.7–2.4)

## 2021-03-23 MED ORDER — MAGNESIUM SULFATE 2 GM/50ML IV SOLN
2.0000 g | Freq: Once | INTRAVENOUS | Status: AC
  Administered 2021-03-23: 2 g via INTRAVENOUS
  Filled 2021-03-23: qty 50

## 2021-03-23 MED ORDER — WARFARIN SODIUM 5 MG PO TABS
5.0000 mg | ORAL_TABLET | ORAL | Status: AC
  Administered 2021-03-23: 5 mg via ORAL
  Filled 2021-03-23: qty 1

## 2021-03-23 MED ORDER — WARFARIN SODIUM 7.5 MG PO TABS: 7.5000 mg | ORAL_TABLET | Freq: Every day | ORAL | Status: DC

## 2021-03-23 MED ORDER — WARFARIN SODIUM 5 MG PO TABS: 5.0000 mg | ORAL_TABLET | Freq: Once | ORAL | Status: DC

## 2021-03-23 MED ORDER — POTASSIUM CHLORIDE CRYS ER 20 MEQ PO TBCR
40.0000 meq | EXTENDED_RELEASE_TABLET | Freq: Once | ORAL | Status: AC
  Administered 2021-03-23: 40 meq via ORAL
  Filled 2021-03-23: qty 2

## 2021-03-23 NOTE — Progress Notes (Signed)
ANTICOAGULATION CONSULT NOTE Pharmacy Consult for warfarin Indication: atrial fibrillation and mechanical aortic valve  No Known Allergies  Patient Measurements: Height: 5\' 11"  (180.3 cm) Weight: 100.5 kg (221 lb 8 oz) IBW/kg (Calculated) : 75.3   Vital Signs: Temp: 97.9 F (36.6 C) (06/28 0525) BP: 132/63 (06/28 0525) Pulse Rate: 85 (06/28 0525)  Labs: Recent Labs    03/21/21 0905 03/22/21 0232 03/23/21 0323  HGB 16.4 15.1  --   HCT 48.4 43.5  --   PLT 252 210  --   APTT 46*  --   --   LABPROT 34.3* 43.5* 35.6*  INR 3.4* 4.6* 3.6*  CREATININE 1.02 1.00 0.82     Estimated Creatinine Clearance: 89.7 mL/min (by C-G formula based on SCr of 0.82 mg/dL).  Assessment: 80 year old male admitted with slurred speech and facial droop transferred from [facility]. Patient with history of chronic anticoagulation with warfarin due to afib and mechanical aortic valve. INR on admission is within range at 3.4.   INR down to 3.6 today  Goal of Therapy:  INR 2.5-3.5 Monitor platelets by anticoagulation protocol: Yes   Plan:  Warfarin 5 mg po x 1 today Daily INR  Thank you [facility], PharmD  03/23/2021 8:10 AM

## 2021-03-23 NOTE — Plan of Care (Signed)
  Problem: Education: Goal: Knowledge of General Education information will improve Description: Including pain rating scale, medication(s)/side effects and non-pharmacologic comfort measures Outcome: Adequate for Discharge   Problem: Health Behavior/Discharge Planning: Goal: Ability to manage health-related needs will improve Outcome: Adequate for Discharge   Problem: Clinical Measurements: Goal: Ability to maintain clinical measurements within normal limits will improve Outcome: Adequate for Discharge Goal: Will remain free from infection Outcome: Adequate for Discharge Goal: Diagnostic test results will improve Outcome: Adequate for Discharge Goal: Respiratory complications will improve Outcome: Adequate for Discharge Goal: Cardiovascular complication will be avoided Outcome: Adequate for Discharge   Problem: Activity: Goal: Risk for activity intolerance will decrease Outcome: Adequate for Discharge   Problem: Nutrition: Goal: Adequate nutrition will be maintained Outcome: Adequate for Discharge   Problem: Coping: Goal: Level of anxiety will decrease Outcome: Adequate for Discharge   Problem: Elimination: Goal: Will not experience complications related to bowel motility Outcome: Adequate for Discharge Goal: Will not experience complications related to urinary retention Outcome: Adequate for Discharge   Problem: Pain Managment: Goal: General experience of comfort will improve Outcome: Adequate for Discharge   Problem: Safety: Goal: Ability to remain free from injury will improve Outcome: Adequate for Discharge   Problem: Skin Integrity: Goal: Risk for impaired skin integrity will decrease Outcome: Adequate for Discharge   Problem: Education: Goal: Knowledge of disease or condition will improve Outcome: Adequate for Discharge Goal: Knowledge of secondary prevention will improve Outcome: Adequate for Discharge Goal: Knowledge of patient specific risk factors  addressed and post discharge goals established will improve Outcome: Adequate for Discharge Goal: Individualized Educational Video(s) Outcome: Adequate for Discharge   Problem: Coping: Goal: Will verbalize positive feelings about self Outcome: Adequate for Discharge   Problem: Health Behavior/Discharge Planning: Goal: Ability to manage health-related needs will improve Outcome: Adequate for Discharge   Problem: Self-Care: Goal: Ability to participate in self-care as condition permits will improve Outcome: Adequate for Discharge Goal: Verbalization of feelings and concerns over difficulty with self-care will improve Outcome: Adequate for Discharge Goal: Ability to communicate needs accurately will improve Outcome: Adequate for Discharge   Problem: Nutrition: Goal: Risk of aspiration will decrease Outcome: Adequate for Discharge   Problem: Ischemic Stroke/TIA Tissue Perfusion: Goal: Complications of ischemic stroke/TIA will be minimized Outcome: Adequate for Discharge   Problem: Acute Rehab OT Goals (only OT should resolve) Goal: OT Additional ADL Goal #1 Outcome: Adequate for Discharge Goal: OT Additional ADL Goal #2 Outcome: Adequate for Discharge   Problem: Acute Rehab PT Goals(only PT should resolve) Goal: Pt Will Go Supine/Side To Sit Outcome: Adequate for Discharge Goal: Patient Will Transfer Sit To/From Stand Outcome: Adequate for Discharge Goal: Pt Will Ambulate Outcome: Adequate for Discharge Goal: Pt Will Go Up/Down Stairs Outcome: Adequate for Discharge

## 2021-03-23 NOTE — Progress Notes (Signed)
Inpatient Diabetes Program Recommendations  AACE/ADA: New Consensus Statement on Inpatient Glycemic Control (2015)  Target Ranges:  Prepandial:   less than 140 mg/dL      Peak postprandial:   less than 180 mg/dL (1-2 hours)      Critically ill patients:  140 - 180 mg/dL   Lab Results  Component Value Date   GLUCAP 211 (H) 03/23/2021   HGBA1C 7.9 (H) 03/21/2021    Review of Glycemic Control Results for SHAREEF, EDDINGER (MRN [id number]) as of 03/23/2021 09:15  Ref. Range 03/22/2021 11:00 03/22/2021 11:37 03/22/2021 16:27 03/22/2021 20:33 03/23/2021 07:42  Glucose-Capillary Latest Ref Range: 70 - 99 mg/dL 388 (H) 828 (H) 003 (H) 191 (H) 211 (H)   Diabetes history: DM2 Outpatient Diabetes medications: Amaryl 4 mg QD, Metformin 1000 mg BID Current orders for Inpatient glycemic control: Novolog 0-9 units TID and 0-5 units QHS  Inpatient Diabetes Program Recommendations:    If CBG's remain > 180 mg/dL, might consider Lantus 10 units (0.1 unit/kg) while inpatient.  Will continue to follow while inpatient.  Thank you, Dulce Sellar, RN, BSN Diabetes Coordinator Inpatient Diabetes Program [phone] (team pager from 8a-5p)

## 2021-03-23 NOTE — Progress Notes (Addendum)
STROKE TEAM PROGRESS NOTE   SUBJECTIVE (INTERVAL HISTORY) His wife and daughter are at the bedside.  Pt sitting in chair, still has right facial droop and dysarthria but no significant arm or leg weakness. PT/OT recommend [facility] PT/OT. INR 3.6 today. Educated on better control BP and glucose at home.    OBJECTIVE Temp:  [97.9 F (36.6 C)] 97.9 F (36.6 C) (06/28 0525) Pulse Rate:  [85] 85 (06/28 0525) Cardiac Rhythm: Normal sinus rhythm (06/28 0700) Resp:  [19] 19 (06/28 0525) BP: (132)/(63) 132/63 (06/28 0525) SpO2:  [97 %] 97 % (06/28 0525)  Recent Labs  Lab 03/22/21 1100 03/22/21 1137 03/22/21 1627 03/22/21 2033 03/23/21 0742  GLUCAP 258* 259* 185* 191* 211*   Recent Labs  Lab 03/21/21 0905 03/22/21 0232 03/23/21 0323  NA 138 137 138  K 3.6 3.3* 3.5  CL 101 103 102  CO2 26 25 21*  GLUCOSE 154* 120* 195*  BUN CREATININE 1.02 1.00 0.82  CALCIUM 8.8* 9.0 8.6*  MG  --  1.4* 1.7  PHOS  --  3.3  --    Recent Labs  Lab 03/21/21 0905  AST 28  ALT 25  ALKPHOS 70  BILITOT 1.0  PROT 7.0  ALBUMIN 4.2   Recent Labs  Lab 03/21/21 0905 03/22/21 0232  WBC 10.7* 8.8  NEUTROABS 8.7*  --   HGB 16.4 15.1  HCT 48.4 43.5  MCV 89.8 89.0  PLT 252 210   No results for input(s): CKTOTAL, CKMB, CKMBINDEX, TROPONINI in the last 168 hours. Recent Labs    03/21/21 0905 03/22/21 0232 03/23/21 0323  LABPROT 34.3* 43.5* 35.6*  INR 3.4* 4.6* 3.6*   Recent Labs    03/22/21 0426 03/22/21 0613  COLORURINE YELLOW RED*  LABSPEC 1.027 TEST NOT REPORTED DUE TO COLOR INTERFERENCE OF URINE PIGMENT  PHURINE 7.0 TEST NOT REPORTED DUE TO COLOR INTERFERENCE OF URINE PIGMENT  GLUCOSEU NEGATIVE TEST NOT REPORTED DUE TO COLOR INTERFERENCE OF URINE PIGMENT*  HGBUR NEGATIVE TEST NOT REPORTED DUE TO COLOR INTERFERENCE OF URINE PIGMENT*  BILIRUBINUR NEGATIVE TEST NOT REPORTED DUE TO COLOR INTERFERENCE OF URINE PIGMENT*  KETONESUR 80* TEST NOT REPORTED DUE TO COLOR INTERFERENCE OF  URINE PIGMENT*  PROTEINUR NEGATIVE TEST NOT REPORTED DUE TO COLOR INTERFERENCE OF URINE PIGMENT*  NITRITE NEGATIVE TEST NOT REPORTED DUE TO COLOR INTERFERENCE OF URINE PIGMENT*  LEUKOCYTESUR NEGATIVE TEST NOT REPORTED DUE TO COLOR INTERFERENCE OF URINE PIGMENT*       Component Value Date/Time   CHOL 108 03/21/2021 1631   TRIG 98 03/21/2021 1631   HDL 30 (L) 03/21/2021 1631   CHOLHDL 3.6 03/21/2021 1631   VLDL 20 03/21/2021 1631   LDLCALC 58 03/21/2021 1631   Lab Results  Component Value Date   HGBA1C 7.9 (H) 03/21/2021      Component Value Date/Time   LABOPIA NONE DETECTED 03/21/2021 0958   COCAINSCRNUR NONE DETECTED 03/21/2021 0958   LABBENZ NONE DETECTED 03/21/2021 0958   AMPHETMU NONE DETECTED 03/21/2021 0958   THCU NONE DETECTED 03/21/2021 0958   LABBARB NONE DETECTED 03/21/2021 0958    Recent Labs  Lab 03/21/21 0905  ETH <10    I have personally reviewed the radiological images below and agree with the radiology interpretations.  CT HEAD WO CONTRAST  Result Date: 03/21/2021 CLINICAL DATA:  Slurred speech, right facial droop, and weakness. EXAM: CT HEAD WITHOUT CONTRAST TECHNIQUE: Contiguous axial images were obtained from the base of the skull through the vertex  without intravenous contrast. COMPARISON:  None. FINDINGS: Brain: There is no evidence of an acute large territory infarct, intracranial hemorrhage, mass, midline shift, or extra-axial fluid collection. There is mild cerebral atrophy. Patchy hypodensities in the cerebral white matter bilaterally are nonspecific but compatible with moderate chronic small vessel ischemic disease. There are lacunar infarcts in the basal ganglia/deep white matter bilaterally which are of indeterminate age. A small chronic infarct is noted inferiorly in the left cerebellar hemisphere. Vascular: Calcified atherosclerosis at the skull base. No hyperdense vessel. Skull: No fracture or suspicious osseous lesion. Sinuses/Orbits: Minimal  mucosal thickening in the left maxillary sinus. Clear mastoid air cells. Unremarkable orbits. Other: None. IMPRESSION: 1. No evidence of an acute large territory infarct or intracranial hemorrhage. 2. Moderate chronic small vessel ischemic disease with age indeterminate lacunar infarcts in the basal ganglia/deep white matter bilaterally. 3. Small chronic left cerebellar infarct. Electronically Signed   By: Sebastian Ache M.D.   On: 03/21/2021 10:00   MR ANGIO HEAD WO CONTRAST  Result Date: 03/22/2021 CLINICAL DATA:  Slurred speech and right facial droop. Left basal ganglia infarction. EXAM: MRA HEAD WITHOUT CONTRAST TECHNIQUE: Angiographic images of the Circle of Willis were acquired using MRA technique without intravenous contrast. COMPARISON:  Brain MRI yesterday. FINDINGS: Anterior circulation: Both internal carotid arteries are widely patent through the skull base and siphon regions. The anterior and middle cerebral vessels are patent without proximal stenosis or large or medium branch vessel occlusion. No aneurysm or vascular malformation. There is mild atherosclerotic irregularity of the more distal MCA branches. Posterior circulation: Both vertebral arteries widely patent to the basilar. No basilar stenosis. Posterior circulation branch vessels show flow. Mild atherosclerotic irregularity of the PCA branches. Anatomic variants: None significant. Other: None IMPRESSION: No large or medium vessel occlusion is visible. No correctable proximal stenosis. Patient does show some atherosclerotic irregularity of the more distal branch vessels. Electronically Signed   By: Paulina Fusi M.D.   On: 03/22/2021 14:07   MR BRAIN WO CONTRAST  Result Date: 03/21/2021 CLINICAL DATA:  Initial evaluation for acute stroke, slurred speech with right-sided facial droop. EXAM: MRI HEAD WITHOUT CONTRAST TECHNIQUE: Multiplanar, multiecho pulse sequences of the brain and surrounding structures were obtained without intravenous  contrast. COMPARISON:  Prior CT from earlier the same day. FINDINGS: Brain: Diffuse prominence of the CSF containing spaces compatible generalized age-related cerebral atrophy. Patchy T2/FLAIR hyperintensity within the periventricular and deep white matter both cerebral hemispheres most consistent with chronic small vessel ischemic disease. Multiple scatter remote lacunar infarcts present about the hemispheric cerebral white matter, basal ganglia, thalami, and cerebellum. 1.6 cm acute ischemic nonhemorrhagic infarcts seen involving the left basal ganglia/corona radiata (series 5, image 80). No associated hemorrhage or mass effect. No other evidence for acute or subacute ischemia. Gray-white matter differentiation otherwise maintained. No acute intracranial hemorrhage. Single chronic microhemorrhage noted at the parasagittal posterior left frontal region, of doubtful significance in isolation. No mass lesion, midline shift or mass effect. No hydrocephalus or extra-axial fluid collection. Pituitary gland suprasellar region normal. Midline structures intact and normal. Vascular: Major intracranial vascular flow voids are maintained. Skull and upper cervical spine: Craniocervical junction within normal limits. Bone marrow signal intensity normal. No scalp soft tissue abnormality. Sinuses/Orbits: Globes and orbital soft tissues demonstrate no acute finding. Paranasal sinuses are largely clear. No mastoid effusion. Inner ear structures grossly normal. Other: None. IMPRESSION: 1. 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 2. Underlying age-related cerebral atrophy with moderate chronic microvascular ischemic  disease, with multiple remote lacunar infarcts involving the hemispheric cerebral white matter, basal ganglia, thalami, and cerebellum. Electronically Signed   By: Rise MuBenjamin  McClintock M.D.   On: 03/21/2021 23:17   DG CHEST PORT 1 VIEW  Result Date: 03/21/2021 CLINICAL DATA:   CVA. EXAM: PORTABLE CHEST 1 VIEW COMPARISON:  11/02/2012 FINDINGS: The cardiac silhouette is mildly enlarged with prior aortic valve replacement noted. A rounded calcified nodule projecting over the left mid lung is unchanged. No acute airspace consolidation, edema, sizable pleural effusion, pneumothorax is identified. No acute osseous abnormality is seen. Surgical clips are noted the thoracic inlet. IMPRESSION: No active disease. Electronically Signed   By: Sebastian AcheAllen  Grady M.D.   On: 03/21/2021 17:27   ECHOCARDIOGRAM COMPLETE  Result Date: 03/22/2021    ECHOCARDIOGRAM REPORT   Patient Name:   Joseph CreeROBERT A Padilla Date of Exam: 03/22/2021 Medical Rec #:  [id number]       Height:       71.0 in Accession #:    [id number]      Weight:       221.5 lb Date of Birth:  07/16/1941       BSA:          2.202 m Patient Age:    80 years        BP:           163/91 mmHg Patient Gender: M               HR:           91 bpm. Exam Location:  Inpatient Procedure: 2D Echo, Cardiac Doppler and Color Doppler Indications:    Stroke I63.9  History:        Patient has no prior history of Echocardiogram examinations. 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  Sonographer:    Roosvelt Maserachel Lane RDCS Referring Phys: [id number] RONDELL A SMITH IMPRESSIONS  1. 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 Aortic valve regurgitation is not visualized. Aortic valve mean gradient measures 24.0 mmHg. Aortic valve Vmax measures 3.15 m/s. Aortic valve acceleration time measures 100 msec. Suboptimal LVOT Assessment for DVI. Slight increase in prosthetic valve gradients from expected.  2. The mitral valve is abnormal. Mild, eccentric mitral valve regurgitation. The mean mitral valve gradient is 4.0 mmHg with average heart rate of 82 bpm.  3. Left ventricular ejection fraction, by estimation, is 50 to 55%. The left ventricle has low normal function. The left ventricle has no regional wall motion  abnormalities. Left ventricular diastolic parameters are indeterminate.  4. Left atrial size was severely dilated.  5. Right ventricular systolic function is normal. The right ventricular size is mildly enlarged. Comparison(s): Prior images unable to be directly viewed, comparison made by report only. Compared with OSH study, slight increase in aortic and mitral gradients. FINDINGS  Left Ventricle: Left ventricular ejection fraction, by estimation, is 50 to 55%. The left ventricle has low normal function. The left ventricle has no regional wall motion abnormalities. The left ventricular internal cavity size was normal in size. There is no left ventricular hypertrophy. Left ventricular diastolic parameters are indeterminate. Right Ventricle: The right ventricular size is mildly enlarged. No increase in right ventricular wall thickness. Right ventricular systolic function is normal. Left Atrium: Left atrial size was severely dilated. Right Atrium: Right atrial size was normal in size. Pericardium: There is no  evidence of pericardial effusion. Mitral Valve: The mitral valve is abnormal. There is mild calcification of the mitral valve leaflet(s). Mild mitral annular calcification. Mild mitral valve regurgitation. The mean mitral valve gradient is 4.0 mmHg with average heart rate of 82 bpm. Tricuspid Valve: The tricuspid valve is grossly normal. Tricuspid valve regurgitation is not demonstrated. No evidence of tricuspid stenosis. Aortic Valve: The aortic valve has been repaired/replaced. Aortic valve regurgitation is not visualized. Aortic valve mean gradient measures 24.0 mmHg. Aortic valve peak gradient measures 39.7 mmHg. Aortic valve area, by VTI measures 0.65 cm. Pulmonic Valve: The pulmonic valve was not well visualized. Pulmonic valve regurgitation is trivial. No evidence of pulmonic stenosis. Aorta: The aortic root and ascending aorta are structurally normal, with no evidence of dilitation. These measuresments  are within normal limits for age and body surface area. IAS/Shunts: The atrial septum is grossly normal.  LEFT VENTRICLE PLAX 2D LVIDd:         5.10 cm  Diastology LVIDs:         3.70 cm  LV e' medial:    4.90 cm/s LV PW:         1.00 cm  LV E/e' medial:  19.3 LV IVS:        1.00 cm  LV e' lateral:   6.53 cm/s LVOT diam:     2.20 cm  LV E/e' lateral: 14.5 LV SV:         42 LV SV Index:   19 LVOT Area:     3.80 cm  RIGHT VENTRICLE RV Basal diam:  4.80 cm RV Mid diam:    3.50 cm RV S prime:     9.57 cm/s TAPSE (M-mode): 2.3 cm LEFT ATRIUM              Index       RIGHT ATRIUM           Index LA diam:        4.80 cm  2.18 cm/m  RA Area:     17.40 cm LA Vol (A2C):   98.8 ml  44.87 ml/m RA Volume:   48.80 ml  22.16 ml/m LA Vol (A4C):   142.0 ml 64.48 ml/m LA Biplane Vol: 123.0 ml 55.86 ml/m  AORTIC VALVE AV Area (Vmax):    0.63 cm AV Area (Vmean):   0.61 cm AV Area (VTI):     0.65 cm AV Vmax:           315.00 cm/s AV Vmean:          234.000 cm/s AV VTI:            0.640 m AV Peak Grad:      39.7 mmHg AV Mean Grad:      24.0 mmHg LVOT Vmax:         52.20 cm/s LVOT Vmean:        37.400 cm/s LVOT VTI:          0.110 m LVOT/AV VTI ratio: 0.17  AORTA Ao Root diam: 3.80 cm Ao Asc diam:  3.50 cm MITRAL VALVE MV Area (PHT): 5.02 cm     SHUNTS MV Mean grad:  4.0 mmHg     Systemic VTI:  0.11 m MV Decel Time: 151 msec     Systemic Diam: 2.20 cm MV E velocity: 94.50 cm/s MV A velocity: 149.00 cm/s MV E/A ratio:  0.63 Riley Lam MD Electronically signed by Riley Lam MD Signature Date/Time: 03/22/2021/2:20:06 PM  Final    VAS US CAROTID  Result Date: 03/22/2021 Carotid Arterial Duplex Study Patient Name:  Joseph Padilla  Date of Exam:   03/22/2021 Medical Rec #: [id number]        Accession #:    [id number] Date of Birth: Jan 04, 1941        Patient Gender: M Patient Age:   80Y Exam Location:  [facility] Procedure:      VAS US CAROTID Referring Phys: [id number] Reyne Dumas [facility]  --------------------------------------------------------------------------------  Indications:       CVA. Risk Factors:      Diabetes. Comparison Study:  No prior studies. Performing Technologist: Chanda Busing RVT  Examination Guidelines: A complete evaluation includes B-mode imaging, spectral Doppler, color Doppler, and power Doppler as needed of all accessible portions of each vessel. Bilateral testing is considered an integral part of a complete examination. Limited examinations for reoccurring indications may be performed as noted.  Right Carotid Findings: +----------+--------+--------+--------+---------------------+------------------+           PSV cm/sEDV cm/sStenosisPlaque Description   Comments           +----------+--------+--------+--------+---------------------+------------------+ CCA Prox  88      4               smooth and           High resistant                                       heterogenous         flow               +----------+--------+--------+--------+---------------------+------------------+ CCA Distal84      9               smooth and           High resistant                                       heterogenous         flow               +----------+--------+--------+--------+---------------------+------------------+ ICA Prox  72      14              smooth and                                                                heterogenous                            +----------+--------+--------+--------+---------------------+------------------+ ICA Distal47      7                                    tortuous           +----------+--------+--------+--------+---------------------+------------------+ ECA       100     0                                                       +----------+--------+--------+--------+---------------------+------------------+ +----------+--------+-------+--------+-------------------+  PSV cm/sEDV  cmsDescribeArm Pressure (mmHG) +----------+--------+-------+--------+-------------------+ [id number]                                         +----------+--------+-------+--------+-------------------+ +---------+--------+--+--------+-+----------------------------+ VertebralPSV cm/s61EDV cm/s9Antegrade and High resistant +---------+--------+--+--------+-+----------------------------+  Left Carotid Findings: +----------+--------+--------+--------+---------------------+------------------+           PSV cm/sEDV cm/sStenosisPlaque Description   Comments           +----------+--------+--------+--------+---------------------+------------------+ CCA Prox  72      8               smooth and           High resistant                                       heterogenous         flow noted in the                                                         proximal CCA.      +----------+--------+--------+--------+---------------------+------------------+ CCA Distal78      6               smooth and           High resistant                                       heterogenous         flow               +----------+--------+--------+--------+---------------------+------------------+ ICA Prox  57      8               smooth and           High resistant                                       heterogenous         flow               +----------+--------+--------+--------+---------------------+------------------+ ICA Distal85      13                                   tortuous           +----------+--------+--------+--------+---------------------+------------------+ ECA       93                                                              +----------+--------+--------+--------+---------------------+------------------+ +----------+--------+--------+--------+-------------------+           PSV cm/sEDV cm/sDescribeArm Pressure (mmHG)  +----------+--------+--------+--------+-------------------+ [id number]                                         +----------+--------+--------+--------+-------------------+ +---------+--------+--+--------+-+---------+  VertebralPSV cm/s48EDV cm/s9Antegrade +---------+--------+--+--------+-+---------+   Summary: Right Carotid: Velocities in the right ICA are consistent with a 1-39% stenosis. Left Carotid: Velocities in the left ICA are consistent with a 1-39% stenosis. Vertebrals: Left vertebral artery demonstrates antegrade flow. Right vertebral             artery demonstrates high resistant flow. *See table(s) above for measurements and observations.  Electronically signed by Delia Heady MD on 03/22/2021 at 5:37:02 PM.    Final      PHYSICAL EXAM  Temp:  [97.9 F (36.6 C)] 97.9 F (36.6 C) (06/28 0525) Pulse Rate:  [85] 85 (06/28 0525) Resp:  [19] 19 (06/28 0525) BP: (132)/(63) 132/63 (06/28 0525) SpO2:  [97 %] 97 % (06/28 0525)  General - Well nourished, well developed, in no apparent distress.  Ophthalmologic - fundi not visualized due to noncooperation.  Cardiovascular - Regular rhythm and rate.  Mental Status -  Level of arousal and orientation to time, place, and person were intact. Language including expression, naming, repetition, comprehension was assessed and found intact. Fund of Knowledge was assessed and was intact.  Cranial Nerves II - XII - II - Visual field intact OU. III, IV, VI - Extraocular movements intact. V - Facial sensation intact bilaterally. VII - right facial droop. VIII - Hearing & vestibular intact bilaterally. X - Palate elevates symmetrically. Mild dysarthria XI - Chin turning & shoulder shrug intact bilaterally. XII - Tongue protrusion mildly to the right.  Motor Strength - The patient's strength was normal in all extremities and pronator drift was absent.  Bulk was normal and fasciculations were absent.   Motor Tone - Muscle tone was  assessed at the neck and appendages and was normal.  Reflexes - The patient's reflexes were symmetrical in all extremities and he had no pathological reflexes.  Sensory - Light touch, temperature/pinprick were assessed and were symmetrical.    Coordination - The patient had normal movements in the hands with no ataxia or dysmetria.  Tremor was absent.  Gait and Station - deferred.   ASSESSMENT/PLAN Joseph Padilla is a 80 y.o. 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 No tPA given due to outside window.    Stroke:  left BG/CR infarct likely secondary to small vessel disease source CT no acute finding, old left cerebellar infarct MRI  left BG/CR acute infarct MRA  unremarkable  Carotid Doppler  unremarkable 2D Echo  EF 50-55% LDL 58 HgbA1c 7.9 INR 3.4->3.6 Coumadin for VTE prophylaxis warfarin daily prior to admission, now on warfarin daily, continue on discharge with INR goal 2.5-3.5 Patient counseled to be compliant with his antithrombotic medications Ongoing aggressive stroke risk factor management Therapy recommendations:  HH PT/OT Disposition:  home  Diabetes HgbA1c 7.9 goal < 7.0 Uncontrolled Currently on metformin at home CBG monitoring SSI DM education and close PCP follow up  Hypertension Stable Long term BP goal normotensive  Hyperlipidemia Home meds:  crestor 20 and zetia 10  LDL 58, goal < 70 Now on home meds Continue statin and zetia at discharge  Other Stroke Risk Factors Advanced age Obesity, Body mass index is 30.89 kg/m.  AV mechanical valve on coumadin with therapeutic INRs  Other Active Problems Leukocytosis, resolved WBC 10.7->8.8  Hospital day # 2  Neurology will sign off. Please call with questions. Pt will follow up with stroke clinic NP at [facility] in about 4 weeks.  Thanks for the consult.   Marvel Plan, MD PhD Stroke Neurology 03/23/2021 10:36  AM    To contact Stroke Continuity provider, please refer to [email or web address]. After hours, contact General Neurology

## 2021-03-23 NOTE — TOC Transition Note (Signed)
Transition of Care [facility]) - CM/SW Discharge Note   Patient Details  Name: Joseph Padilla MRN: [id number] Date of Birth: 10-08-1940  [facility] CM/SW Contact:  Mearl Latin, LCSW Phone Number: 03/23/2021, 10:41 AM   Clinical Narrative:    CSW received consult for possible home health services at time of discharge. CSW spoke with patient and spouse at bedside. Patient reported that he would like home health services.  Patient reports agreement with [facility]. CSW sent referral for review and was accepted. CSW provided Medicare HH ratings list. CSW discussed equipment needs with patient and he stated he has a rolling walker at home. CSW confirmed PCP, phone number, and address with patient. He stated he has a poodle so she will need to be put up prior to home health coming out. Patient's spouse stated she is providing transportation. No further questions reported at this time.     Final next level of care: Home w Home Health Services Barriers to Discharge: No Barriers Identified   Patient Goals and CMS Choice Patient states their goals for this hospitalization and ongoing recovery are:: return home CMS Medicare.gov Compare Post Acute Care list provided to:: Patient Choice offered to / list presented to : Patient, Spouse  Discharge Placement                       Discharge Plan and Services   Discharge Planning Services: CM Consult Post Acute Care Choice: Home Health                    HH Arranged: PT, OT [facility] Agency: [facility] Date [facility] Agency Contacted: 03/23/21 Time HH Agency Contacted: 1040 Representative spoke with at [facility] Agency: Amy  Social Determinants of Health (SDOH) Interventions     Readmission Risk Interventions No flowsheet data found.

## 2021-03-23 NOTE — Progress Notes (Signed)
Physical Therapy Treatment Patient Details Name: Joseph Padilla MRN: [id number] DOB: Jun 01, 1941 Today's Date: 03/23/2021    History of Present Illness 80 y.o. male presenting to ED 6/26 with slurred speech and R-sided facial droop. CT (-) acute abnormality. 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 PMHx significant for HTN, HLD, DMII, and mechanical aortic valve replacement.    PT Comments    Pt admitted with above diagnosis. Pt was able to ambulate but needs min guard to supervision currently and recommend use of RW with pt and family  made aware of deficits.  Pt and family agree to use RW intially with pt reluctant.  Pt does agree that he may be inattentive to right side and some incoordination of LE persists. HHPT to f/u with pt as well. Pt currently with functional limitations due to balance and endurance deficits. Pt will benefit from skilled PT to increase their independence and safety with mobility to allow discharge to the venue listed below.      Follow Up Recommendations  Home health PT     Equipment Recommendations  Rolling walker with 5" wheels (unless pt does borrow daughter or wifes RW)    Recommendations for Other Services       Precautions / Restrictions Precautions Precautions: Fall Restrictions Weight Bearing Restrictions: No    Mobility  Bed Mobility Overal bed mobility: Needs Assistance Bed Mobility: Supine to Sit     Supine to sit: Supervision     General bed mobility comments: Supervision A for safety.    Transfers Overall transfer level: Needs assistance Equipment used: None;Rolling walker (2 wheeled) Transfers: Sit to/from Stand Sit to Stand: Min guard         General transfer comment: Min guard for  steadying/balance.  Ambulation/Gait Ambulation/Gait assistance: Min guard Gait Distance (Feet): 250 Feet Assistive device: Rolling walker (2 wheeled);None Gait Pattern/deviations: Step-through pattern;Decreased stride length;Drifts right/left   Gait velocity interpretation: <1.8 ft/sec, indicate of risk for recurrent falls General Gait Details: Pt continues with unsteady gait when not using RW as he did stagger but could self correct.  pt wtih questionable decr attention to right and made pt aware that he needs to scan right environment. Pt making excuses that PT was holding onto him and when PT let go of belt this continued to happen.  Pt agreed he noted the instability and the drift to right at times as well as the inattention. Pt encouraged to use the RW at home and pt agrees. Family was present and also agrees to reinforce.   Stairs             Wheelchair Mobility    Modified Rankin (Stroke Patients Only) Modified Rankin (Stroke Patients Only) Pre-Morbid Rankin Score: No symptoms Modified Rankin: Moderately severe disability     Balance Overall balance assessment: Needs assistance Sitting-balance support: No upper extremity supported;Feet supported Sitting balance-Leahy Scale: Good     Standing balance support: Single extremity supported;No upper extremity supported;During functional activity Standing balance-Leahy Scale: Fair Standing balance comment: Able to maintain static standing balance without UE support. Reliant on at least unilateral UE support with dynamic tasks.                            Cognition Arousal/Alertness: Awake/alert Behavior During Therapy: WFL for tasks  assessed/performed Overall Cognitive Status: Within Functional Limits for tasks assessed                                        Exercises      General Comments General comments (skin integrity, edema, etc.): VSS      Pertinent Vitals/Pain Pain Assessment:  No/denies pain    Home Living     Available Help at Discharge: Family Type of Home: House              Prior Function            PT Goals (current goals can now be found in the care plan section) Acute Rehab PT Goals Patient Stated Goal: To return home. Progress towards PT goals: Progressing toward goals    Frequency    Min 3X/week      PT Plan Current plan remains appropriate    Co-evaluation              AM-PAC PT "6 Clicks" Mobility   Outcome Measure  Help needed turning from your back to your side while in a flat bed without using bedrails?: None Help needed moving from lying on your back to sitting on the side of a flat bed without using bedrails?: None Help needed moving to and from a bed to a chair (including a wheelchair)?: A Little Help needed standing up from a chair using your arms (e.g., wheelchair or bedside chair)?: A Little Help needed to walk in hospital room?: A Little Help needed climbing 3-5 steps with a railing? : A Little 6 Click Score: 20    End of Session Equipment Utilized During Treatment: Gait belt Activity Tolerance: Patient tolerated treatment well Patient left: with call bell/phone within reach;in chair;with family/visitor present Nurse Communication: Mobility status PT Visit Diagnosis: Muscle weakness (generalized) (M62.81)     Time: 0454-0981 PT Time Calculation (min) (ACUTE ONLY): 14 min  Charges:  $Gait Training: 8-22 mins                     Mikia Delaluz M,PT Acute Rehab Services [phone] (pager)    Bevelyn Buckles 03/23/2021, 11:33 AM

## 2021-03-23 NOTE — Evaluation (Signed)
Speech Language Pathology Evaluation Patient Details Name: Joseph Padilla MRN: [id number] DOB: [birth year] Today's Date: 03/23/2021 Time: 0745-0800 SLP Time Calculation (min) (ACUTE ONLY): 15 min  Problem List:  Patient Active Problem List   Diagnosis Date Noted   Cerebral thrombosis with cerebral infarction 03/23/2021   Dysarthria 03/21/2021   Right sided weakness 03/21/2021   Abnormal urinalysis 03/21/2021   Mechanical heart valve present 03/21/2021   Chronic anticoagulation 03/21/2021   Hypocalcemia 03/21/2021   Diabetes mellitus type 2 in obese (HCC) 03/21/2021   Past Medical History:  Past Medical History:  Diagnosis Date   Diabetes mellitus type 2 in obese (HCC)    History of mechanical aortic valve replacement    valve    Hypertension    Past Surgical History:  Past Surgical History:  Procedure Laterality Date   APPENDECTOMY     BACK SURGERY     VALVE REPLACEMENT     HPI:  80 y.o. male presenting to ED 6/26 with slurred speech and R-sided facial droop. CT (-) acute abnormality. 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 PMHx significant for HTN, HLD, DMII, and mechanical aortic valve replacement.   Assessment / Plan / Recommendation Clinical Impression  Pt demonstrates a mild dysarthria, primarily due to right lingual weakness and sensory loss. Pt is 100% intelligible, but articulation is imprecise, particularly with /s/ and other alveolar and palatal stops. Pt is already aware that slow rate is beneficial. SLP introduced increased volume and overarticulation as further modifications that can increase intelligiblity. Emphasized, making all the sounds in a word, particularly /s/ and final consonant. Pt able to return demonstrate after a model reading short phrases  and then with verbal cues in conversational speech. Recommend HH SLP f/u.    SLP Assessment  SLP Recommendation/Assessment: All further Speech Lanaguage Pathology  needs can be addressed in the next venue of care SLP Visit Diagnosis: Dysarthria and anarthria (R47.1)    Follow Up Recommendations  Home health SLP    Frequency and Duration           SLP Evaluation Cognition  Overall Cognitive Status: Within Functional Limits for tasks assessed       Comprehension  Auditory Comprehension Overall Auditory Comprehension: Appears within functional limits for tasks assessed Reading Comprehension Reading Status: Within funtional limits    Expression Verbal Expression Overall Verbal Expression: Appears within functional limits for tasks assessed   Oral / Motor  Oral Motor/Sensory Function Overall Oral Motor/Sensory Function: Mild impairment Facial ROM: Reduced right;Suspected CN VII (facial) dysfunction Facial Symmetry: Abnormal symmetry right;Suspected CN VII (facial) dysfunction Facial Strength: Reduced right;Suspected CN VII (facial) dysfunction Lingual ROM: Reduced right Lingual Symmetry: Abnormal symmetry right Lingual Strength: Reduced Lingual Sensation: Reduced Motor Speech Overall Motor Speech: Impaired Respiration: Within functional limits Phonation: Normal Resonance: Within functional limits Articulation: Impaired Level of Impairment: Word Intelligibility: Intelligible Motor Planning: Witnin functional limits Motor Speech Errors: Aware;Consistent   GO                   Harlon Ditty, MA CCC-SLP  Acute Rehabilitation Services Pager [phone] Office [phone]  Claudine Mouton 03/23/2021, 8:42 AM

## 2021-03-23 NOTE — Discharge Summary (Signed)
PATIENT DETAILS Name: Joseph Padilla Age: 80 y.o. Sex: male Date of Birth: 11/04/1940 MRN: [id number]. Admitting Physician: Clydie Braun, MD JXB:JYNWGN, Isidor Holts., PA-C  Admit Date: 03/21/2021 Discharge date: 03/23/2021  Recommendations for Outpatient Follow-up:  Follow up with PCP in 1-2 weeks Please obtain CMP/CBC in one week Has microscopic hematuria-please refer to urology for further work-up. Please ensure follow-up at the stroke clinic.  Admitted From:  Home  Disposition: Home with home health services   Home Health: Yes  Equipment/Devices: None  Discharge Condition: Stable  CODE STATUS: FULL CODE  Diet recommendation:  Diet Order             Diet - low sodium heart healthy           Diet heart healthy/carb modified Room service appropriate? Yes; Fluid consistency: Thin  Diet effective now                    Brief Narrative: Patient is a 80 y.o. 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  See below for further details.   Significant events: 6/26>> admit for slurred speech-found to have acute CVA.   Significant studies: 6/26>> MRI brain: 1.6 cm acute infarct in left basal ganglia/corona radiata 6/26>> LDL: 58 6/27>> A1c: 7.9 6/27>> LVEF 50-55% 6/27>> carotid Doppler ultrasound: No significant stenosis   Antimicrobial therapy: None   Microbiology data: 6/26>> COVID/influenza PCR: Negative   Procedures : None   Consults: Neurology    Brief Hospital Course: Acute CVA: Some mild dysarthria persists-has very mild right-sided weakness as well.  Work-up as above.  Suspected to have small vessel disease because of acute CVA.  Discussed with neurology-Dr. Shanda Bumps to discharge-patient will need follow-up at the stroke clinic.  Home health services will be arranged prior to discharge.   History of mechanical aortic valve replacement: INR  supratherapeutic on presentation-Coumadin held-discussed with pharmacy-patient will get 5 mg of Coumadin today, following which patient will resume usual home dosing of 7.5 mg daily.  No evidence of bleeding.  Patient was instructed to follow-up at his Coumadin clinic in [facility] in the next 1-2 days.   HTN: Initially permissive hypertension was allowed-resume antihypertensives on discharge.    HLD: On Crestor and Zetia   DM-2: CBG stable-resume metformin, Amaryl on discharge-further optimization deferred to outpatient MD.    Hypokalemia: Repleted.   Hypomagnesemia: Repleted.   Microscopic hematuria: Probably due to Coumadin-however will need outpatient urology evaluation.  Patient aware of this finding-and I have encouraged him to talk to his primary care practitioner to get a referral with urology.   Obesity: Estimated body mass index is 30.89 kg/m as calculated from the following:   Height as of this encounter:  (1.803 m).   Weight as of this encounter: 100.5 kg.    Procedures None  Discharge Diagnoses:  Active Problems:   Dysarthria   Right sided weakness   Abnormal urinalysis   Mechanical heart valve present   Chronic anticoagulation   Hypocalcemia   Diabetes mellitus type 2 in obese (HCC)   Cerebral thrombosis with cerebral infarction   Discharge Instructions:  Activity:  As tolerated with Full fall precautions use walker/cane & assistance as needed  Discharge Instructions     Ambulatory referral to Neurology   Complete by: As directed    An appointment is requested in approximately: 4 weeks   Diet - low sodium  heart healthy   Complete by: As directed    Discharge instructions   Complete by: As directed    Follow with Primary MD  Joseph Campbell., PA-C in 1-2 weeks  Please get a complete blood count and chemistry panel checked by your Primary MD at your next visit, and again as instructed by your Primary MD.  Get Medicines reviewed and  adjusted: Please take all your medications with you for your next visit with your Primary MD  Laboratory/radiological data: Please request your Primary MD to go over all hospital tests and procedure/radiological results at the follow up, please ask your Primary MD to get all Hospital records sent to his/her office.  In some cases, they will be blood work, cultures and biopsy results pending at the time of your discharge. Please request that your primary care M.D. follows up on these results.  Also Note the following: If you experience worsening of your admission symptoms, develop shortness of breath, life threatening emergency, suicidal or homicidal thoughts you must seek medical attention immediately by calling 911 or calling your MD immediately  if symptoms less severe.  You must read complete instructions/literature along with all the possible adverse reactions/side effects for all the Medicines you take and that have been prescribed to you. Take any new Medicines after you have completely understood and accpet all the possible adverse reactions/side effects.   Do not drive when taking Pain medications or sleeping medications (Benzodaizepines)  Do not take more than prescribed Pain, Sleep and Anxiety Medications. It is not advisable to combine anxiety,sleep and pain medications without talking with your primary care practitioner  Special Instructions: If you have smoked or chewed Tobacco  in the last 2 yrs please stop smoking, stop any regular Alcohol  and or any Recreational drug use.  Wear Seat belts while driving.  Please note: You were cared for by a hospitalist during your hospital stay. Once you are discharged, your primary care physician will handle any further medical issues. Please note that NO REFILLS for any discharge medications will be authorized once you are discharged, as it is imperative that you return to your primary care physician (or establish a relationship with a  primary care physician if you do not have one) for your post hospital discharge needs so that they can reassess your need for medications and monitor your lab values.   1.)  You were found to have a small amount of blood in your urine-please ask your primary care practitioner to refer you to urology.  You need further evaluation including more studies-in some cases blood in the urine can be from cancer.  2.)  You will get a call from the stroke clinic-please follow as instructed.  If you do not hear from them-please give them a call.  3.)  Please get your INR checked in the next 2 days for further optimization of your Coumadin dosage.   Increase activity slowly   Complete by: As directed       Allergies as of 03/23/2021   No Known Allergies      Medication List     TAKE these medications    ALPHA LIPOIC ACID PO Take 1 capsule by mouth daily.   ezetimibe 10 MG tablet Commonly known as: ZETIA Take 10 mg by mouth daily.   fish oil-omega-3 fatty acids 1000 MG capsule Take 1 g by mouth daily.   folic acid 1 MG tablet Commonly known as: FOLVITE Take 1 mg by mouth daily.  GARLIC PO Take 1 tablet by mouth daily.   glimepiride 4 MG tablet Commonly known as: AMARYL Take 4 mg by mouth daily before breakfast.   HYDROcodone-acetaminophen 5-325 MG tablet Commonly known as: NORCO/VICODIN Take 1 tablet by mouth every 6 (six) hours as needed for pain.   lisinopril-hydrochlorothiazide 20-12.5 MG tablet Commonly known as: ZESTORETIC Take 1 tablet by mouth daily.   metFORMIN 1000 MG tablet Commonly known as: GLUCOPHAGE Take 1,000 mg by mouth 2 (two) times daily with a meal.   metoprolol tartrate 100 MG tablet Commonly known as: LOPRESSOR Take 100 mg by mouth daily.   rosuvastatin 20 MG tablet Commonly known as: CRESTOR Take 20 mg by mouth daily.   tamsulosin 0.4 MG Caps capsule Commonly known as: Flomax Take 1 capsule (0.4 mg total) by mouth daily after supper.   VITAMIN  B-12 PO Take 1 tablet by mouth daily.   warfarin 7.5 MG tablet Commonly known as: COUMADIN Take 1 tablet (7.5 mg total) by mouth at bedtime. Start taking on: March 24, 2021 What changed: Another medication with the same name was removed. Continue taking this medication, and follow the directions you see here.        Follow-up Information     Joseph Campbell., PA-C. Schedule an appointment as soon as possible for a visit in 1 week(s).   Specialty: Family Medicine Contact information: [address] [place] [address] [phone]                No Known Allergies   Other Procedures/Studies: CT HEAD WO CONTRAST  Result Date: 03/21/2021 CLINICAL DATA:  Slurred speech, right facial droop, and weakness. EXAM: CT HEAD WITHOUT CONTRAST TECHNIQUE: Contiguous axial images were obtained from the base of the skull through the vertex without intravenous contrast. COMPARISON:  None. FINDINGS: Brain: There is no evidence of an acute large territory infarct, intracranial hemorrhage, mass, midline shift, or extra-axial fluid collection. There is mild cerebral atrophy. Patchy hypodensities in the cerebral white matter bilaterally are nonspecific but compatible with moderate chronic small vessel ischemic disease. There are lacunar infarcts in the basal ganglia/deep white matter bilaterally which are of indeterminate age. A small chronic infarct is noted inferiorly in the left cerebellar hemisphere. Vascular: Calcified atherosclerosis at the skull base. No hyperdense vessel. Skull: No fracture or suspicious osseous lesion. Sinuses/Orbits: Minimal mucosal thickening in the left maxillary sinus. Clear mastoid air cells. Unremarkable orbits. Other: None. IMPRESSION: 1. No evidence of an acute large territory infarct or intracranial hemorrhage. 2. Moderate chronic small vessel ischemic disease with age indeterminate lacunar infarcts in the basal ganglia/deep white matter bilaterally. 3. Small  chronic left cerebellar infarct. Electronically Signed   By: Sebastian Ache M.D.   On: 03/21/2021 10:00   MR ANGIO HEAD WO CONTRAST  Result Date: 03/22/2021 CLINICAL DATA:  Slurred speech and right facial droop. Left basal ganglia infarction. EXAM: MRA HEAD WITHOUT CONTRAST TECHNIQUE: Angiographic images of the Circle of Willis were acquired using MRA technique without intravenous contrast. COMPARISON:  Brain MRI yesterday. FINDINGS: Anterior circulation: Both internal carotid arteries are widely patent through the skull base and siphon regions. The anterior and middle cerebral vessels are patent without proximal stenosis or large or medium branch vessel occlusion. No aneurysm or vascular malformation. There is mild atherosclerotic irregularity of the more distal MCA branches. Posterior circulation: Both vertebral arteries widely patent to the basilar. No basilar stenosis. Posterior circulation branch vessels show flow. Mild atherosclerotic irregularity of the PCA branches. Anatomic  variants: None significant. Other: None IMPRESSION: No large or medium vessel occlusion is visible. No correctable proximal stenosis. Patient does show some atherosclerotic irregularity of the more distal branch vessels. Electronically Signed   By: Paulina FusiMark  Shogry M.D.   On: 03/22/2021 14:07   MR BRAIN WO CONTRAST  Result Date: 03/21/2021 CLINICAL DATA:  Initial evaluation for acute stroke, slurred speech with right-sided facial droop. EXAM: MRI HEAD WITHOUT CONTRAST TECHNIQUE: Multiplanar, multiecho pulse sequences of the brain and surrounding structures were obtained without intravenous contrast. COMPARISON:  Prior CT from earlier the same day. FINDINGS: Brain: Diffuse prominence of the CSF containing spaces compatible generalized age-related cerebral atrophy. Patchy T2/FLAIR hyperintensity within the periventricular and deep white matter both cerebral hemispheres most consistent with chronic small vessel ischemic disease. Multiple  scatter remote lacunar infarcts present about the hemispheric cerebral white matter, basal ganglia, thalami, and cerebellum. 1.6 cm acute ischemic nonhemorrhagic infarcts seen involving the left basal ganglia/corona radiata (series 5, image 80). No associated hemorrhage or mass effect. No other evidence for acute or subacute ischemia. Gray-white matter differentiation otherwise maintained. No acute intracranial hemorrhage. Single chronic microhemorrhage noted at the parasagittal posterior left frontal region, of doubtful significance in isolation. No mass lesion, midline shift or mass effect. No hydrocephalus or extra-axial fluid collection. Pituitary gland suprasellar region normal. Midline structures intact and normal. Vascular: Major intracranial vascular flow voids are maintained. Skull and upper cervical spine: Craniocervical junction within normal limits. Bone marrow signal intensity normal. No scalp soft tissue abnormality. Sinuses/Orbits: Globes and orbital soft tissues demonstrate no acute finding. Paranasal sinuses are largely clear. No mastoid effusion. Inner ear structures grossly normal. Other: None. IMPRESSION: 1. 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 2. Underlying age-related cerebral atrophy with moderate chronic microvascular ischemic disease, with multiple remote lacunar infarcts involving the hemispheric cerebral white matter, basal ganglia, thalami, and cerebellum. Electronically Signed   By: Rise MuBenjamin  McClintock M.D.   On: 03/21/2021 23:17   DG CHEST PORT 1 VIEW  Result Date: 03/21/2021 CLINICAL DATA:  CVA. EXAM: PORTABLE CHEST 1 VIEW COMPARISON:  11/02/2012 FINDINGS: The cardiac silhouette is mildly enlarged with prior aortic valve replacement noted. A rounded calcified nodule projecting over the left mid lung is unchanged. No acute airspace consolidation, edema, sizable pleural effusion, pneumothorax is identified. No acute osseous abnormality is  seen. Surgical clips are noted the thoracic inlet. IMPRESSION: No active disease. Electronically Signed   By: Sebastian AcheAllen  Grady M.D.   On: 03/21/2021 17:27   ECHOCARDIOGRAM COMPLETE  Result Date: 03/22/2021    ECHOCARDIOGRAM REPORT   Patient Name:   Joseph Padilla Date of Exam: 03/22/2021 Medical Rec #:  [id number]       Height:       71.0 in Accession #:    [id number]      Weight:       221.5 lb Date of Birth:  1940/10/10       BSA:          2.202 m Patient Age:    80 years        BP:           163/91 mmHg Patient Gender: M               HR:           91 bpm. Exam Location:  Inpatient Procedure: 2D Echo, Cardiac Doppler and Color Doppler Indications:    Stroke I63.9  History:  Patient has no prior history of Echocardiogram examinations. 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  Sonographer:    Roosvelt Maser RDCS Referring Phys: [id number] RONDELL A SMITH IMPRESSIONS  1. 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 Aortic valve regurgitation is not visualized. Aortic valve mean gradient measures 24.0 mmHg. Aortic valve Vmax measures 3.15 m/s. Aortic valve acceleration time measures 100 msec. Suboptimal LVOT Assessment for DVI. Slight increase in prosthetic valve gradients from expected.  2. The mitral valve is abnormal. Mild, eccentric mitral valve regurgitation. The mean mitral valve gradient is 4.0 mmHg with average heart rate of 82 bpm.  3. Left ventricular ejection fraction, by estimation, is 50 to 55%. The left ventricle has low normal function. The left ventricle has no regional wall motion abnormalities. Left ventricular diastolic parameters are indeterminate.  4. Left atrial size was severely dilated.  5. Right ventricular systolic function is normal. The right ventricular size is mildly enlarged. Comparison(s): Prior images unable to be directly viewed, comparison made by report only. Compared with OSH study, slight increase in aortic and  mitral gradients. FINDINGS  Left Ventricle: Left ventricular ejection fraction, by estimation, is 50 to 55%. The left ventricle has low normal function. The left ventricle has no regional wall motion abnormalities. The left ventricular internal cavity size was normal in size. There is no left ventricular hypertrophy. Left ventricular diastolic parameters are indeterminate. Right Ventricle: The right ventricular size is mildly enlarged. No increase in right ventricular wall thickness. Right ventricular systolic function is normal. Left Atrium: Left atrial size was severely dilated. Right Atrium: Right atrial size was normal in size. Pericardium: There is no evidence of pericardial effusion. Mitral Valve: The mitral valve is abnormal. There is mild calcification of the mitral valve leaflet(s). Mild mitral annular calcification. Mild mitral valve regurgitation. The mean mitral valve gradient is 4.0 mmHg with average heart rate of 82 bpm. Tricuspid Valve: The tricuspid valve is grossly normal. Tricuspid valve regurgitation is not demonstrated. No evidence of tricuspid stenosis. Aortic Valve: The aortic valve has been repaired/replaced. Aortic valve regurgitation is not visualized. Aortic valve mean gradient measures 24.0 mmHg. Aortic valve peak gradient measures 39.7 mmHg. Aortic valve area, by VTI measures 0.65 cm. Pulmonic Valve: The pulmonic valve was not well visualized. Pulmonic valve regurgitation is trivial. No evidence of pulmonic stenosis. Aorta: The aortic root and ascending aorta are structurally normal, with no evidence of dilitation. These measuresments are within normal limits for age and body surface area. IAS/Shunts: The atrial septum is grossly normal.  LEFT VENTRICLE PLAX 2D LVIDd:         5.10 cm  Diastology LVIDs:         3.70 cm  LV e' medial:    4.90 cm/s LV PW:         1.00 cm  LV E/e' medial:  19.3 LV IVS:        1.00 cm  LV e' lateral:   6.53 cm/s LVOT diam:     2.20 cm  LV E/e' lateral: 14.5  LV SV:         42 LV SV Index:   19 LVOT Area:     3.80 cm  RIGHT VENTRICLE RV Basal diam:  4.80 cm RV Mid diam:    3.50 cm RV S prime:     9.57 cm/s TAPSE (M-mode): 2.3 cm  LEFT ATRIUM              Index       RIGHT ATRIUM           Index LA diam:        4.80 cm  2.18 cm/m  RA Area:     17.40 cm LA Vol (A2C):   98.8 ml  44.87 ml/m RA Volume:   48.80 ml  22.16 ml/m LA Vol (A4C):   142.0 ml 64.48 ml/m LA Biplane Vol: 123.0 ml 55.86 ml/m  AORTIC VALVE AV Area (Vmax):    0.63 cm AV Area (Vmean):   0.61 cm AV Area (VTI):     0.65 cm AV Vmax:           315.00 cm/s AV Vmean:          234.000 cm/s AV VTI:            0.640 m AV Peak Grad:      39.7 mmHg AV Mean Grad:      24.0 mmHg LVOT Vmax:         52.20 cm/s LVOT Vmean:        37.400 cm/s LVOT VTI:          0.110 m LVOT/AV VTI ratio: 0.17  AORTA Ao Root diam: 3.80 cm Ao Asc diam:  3.50 cm MITRAL VALVE MV Area (PHT): 5.02 cm     SHUNTS MV Mean grad:  4.0 mmHg     Systemic VTI:  0.11 m MV Decel Time: 151 msec     Systemic Diam: 2.20 cm MV E velocity: 94.50 cm/s MV A velocity: 149.00 cm/s MV E/A ratio:  0.63 Riley Lam MD Electronically signed by Riley Lam MD Signature Date/Time: 03/22/2021/2:20:06 PM    Final    VAS US CAROTID  Result Date: 03/22/2021 Carotid Arterial Duplex Study Patient Name:  Joseph Padilla  Date of Exam:   03/22/2021 Medical Rec #: [id number]        Accession #:    [id number] Date of Birth: Nov 19, 1940        Patient Gender: M Patient Age:   80Y Exam Location:  [facility] Procedure:      VAS US CAROTID Referring Phys: [id number] Reyne Dumas [facility] --------------------------------------------------------------------------------  Indications:       CVA. Risk Factors:      Diabetes. Comparison Study:  No prior studies. Performing Technologist: Chanda Busing RVT  Examination Guidelines: A complete evaluation includes B-mode imaging, spectral Doppler, color Doppler, and power Doppler as needed of all accessible portions  of each vessel. Bilateral testing is considered an integral part of a complete examination. Limited examinations for reoccurring indications may be performed as noted.  Right Carotid Findings: +----------+--------+--------+--------+---------------------+------------------+           PSV cm/sEDV cm/sStenosisPlaque Description   Comments           +----------+--------+--------+--------+---------------------+------------------+ CCA Prox  88      4               smooth and           High resistant                                       heterogenous         flow               +----------+--------+--------+--------+---------------------+------------------+  CCA Distal84      9               smooth and           High resistant                                       heterogenous         flow               +----------+--------+--------+--------+---------------------+------------------+ ICA Prox  72      14              smooth and                                                                heterogenous                            +----------+--------+--------+--------+---------------------+------------------+ ICA Distal47      7                                    tortuous           +----------+--------+--------+--------+---------------------+------------------+ ECA       100     0                                                       +----------+--------+--------+--------+---------------------+------------------+ +----------+--------+-------+--------+-------------------+           PSV cm/sEDV cmsDescribeArm Pressure (mmHG) +----------+--------+-------+--------+-------------------+ [id number]                                         +----------+--------+-------+--------+-------------------+ +---------+--------+--+--------+-+----------------------------+ VertebralPSV cm/s61EDV cm/s9Antegrade and High resistant  +---------+--------+--+--------+-+----------------------------+  Left Carotid Findings: +----------+--------+--------+--------+---------------------+------------------+           PSV cm/sEDV cm/sStenosisPlaque Description   Comments           +----------+--------+--------+--------+---------------------+------------------+ CCA Prox  72      8               smooth and           High resistant                                       heterogenous         flow noted in the                                                         proximal CCA.      +----------+--------+--------+--------+---------------------+------------------+ CCA Distal78  6               smooth and           High resistant                                       heterogenous         flow               +----------+--------+--------+--------+---------------------+------------------+ ICA Prox  57      8               smooth and           High resistant                                       heterogenous         flow               +----------+--------+--------+--------+---------------------+------------------+ ICA Distal85      13                                   tortuous           +----------+--------+--------+--------+---------------------+------------------+ ECA       93                                                              +----------+--------+--------+--------+---------------------+------------------+ +----------+--------+--------+--------+-------------------+           PSV cm/sEDV cm/sDescribeArm Pressure (mmHG) +----------+--------+--------+--------+-------------------+ [id number]                                         +----------+--------+--------+--------+-------------------+ +---------+--------+--+--------+-+---------+ VertebralPSV cm/s48EDV cm/s9Antegrade +---------+--------+--+--------+-+---------+   Summary: Right Carotid: Velocities in the right  ICA are consistent with a 1-39% stenosis. Left Carotid: Velocities in the left ICA are consistent with a 1-39% stenosis. Vertebrals: Left vertebral artery demonstrates antegrade flow. Right vertebral             artery demonstrates high resistant flow. *See table(s) above for measurements and observations.  Electronically signed by Delia Heady MD on 03/22/2021 at 5:37:02 PM.    Final      TODAY-DAY OF DISCHARGE:  Subjective:   Joseph Padilla today has no headache,no chest abdominal pain,no new weakness tingling or numbness, feels much better wants to go home today.   Objective:   Blood pressure 132/63, pulse 85, temperature 97.9 F (36.6 C), resp. rate 19, height 5\' 11"  (1.803 m), weight 100.5 kg, SpO2 97 %.  Intake/Output Summary (Last 24 hours) at 03/23/2021 0951 Last data filed at 03/22/2021 1539 Gross per 24 hour  Intake --  Output 900 ml  Net -900 ml   Filed Weights   03/21/21 0859  Weight: 100.5 kg    Exam: Awake Alert, Oriented *3, No new F.N deficits, Normal affect Shorter.AT,PERRAL Supple Neck,No JVD, No cervical lymphadenopathy appriciated.  Symmetrical Chest wall movement, Good air movement bilaterally, CTAB RRR,No Gallops,Rubs or  new Murmurs, No Parasternal Heave +ve B.Sounds, Abd Soft, Non tender, No organomegaly appriciated, No rebound -guarding or rigidity. No Cyanosis, Clubbing or edema, No new Rash or bruise   PERTINENT RADIOLOGIC STUDIES: MR ANGIO HEAD WO CONTRAST  Result Date: 03/22/2021 CLINICAL DATA:  Slurred speech and right facial droop. Left basal ganglia infarction. EXAM: MRA HEAD WITHOUT CONTRAST TECHNIQUE: Angiographic images of the Circle of Willis were acquired using MRA technique without intravenous contrast. COMPARISON:  Brain MRI yesterday. FINDINGS: Anterior circulation: Both internal carotid arteries are widely patent through the skull base and siphon regions. The anterior and middle cerebral vessels are patent without proximal stenosis or large or  medium branch vessel occlusion. No aneurysm or vascular malformation. There is mild atherosclerotic irregularity of the more distal MCA branches. Posterior circulation: Both vertebral arteries widely patent to the basilar. No basilar stenosis. Posterior circulation branch vessels show flow. Mild atherosclerotic irregularity of the PCA branches. Anatomic variants: None significant. Other: None IMPRESSION: No large or medium vessel occlusion is visible. No correctable proximal stenosis. Patient does show some atherosclerotic irregularity of the more distal branch vessels. Electronically Signed   By: Paulina Fusi M.D.   On: 03/22/2021 14:07   MR BRAIN WO CONTRAST  Result Date: 03/21/2021 CLINICAL DATA:  Initial evaluation for acute stroke, slurred speech with right-sided facial droop. EXAM: MRI HEAD WITHOUT CONTRAST TECHNIQUE: Multiplanar, multiecho pulse sequences of the brain and surrounding structures were obtained without intravenous contrast. COMPARISON:  Prior CT from earlier the same day. FINDINGS: Brain: Diffuse prominence of the CSF containing spaces compatible generalized age-related cerebral atrophy. Patchy T2/FLAIR hyperintensity within the periventricular and deep white matter both cerebral hemispheres most consistent with chronic small vessel ischemic disease. Multiple scatter remote lacunar infarcts present about the hemispheric cerebral white matter, basal ganglia, thalami, and cerebellum. 1.6 cm acute ischemic nonhemorrhagic infarcts seen involving the left basal ganglia/corona radiata (series 5, image 80). No associated hemorrhage or mass effect. No other evidence for acute or subacute ischemia. Gray-white matter differentiation otherwise maintained. No acute intracranial hemorrhage. Single chronic microhemorrhage noted at the parasagittal posterior left frontal region, of doubtful significance in isolation. No mass lesion, midline shift or mass effect. No hydrocephalus or extra-axial fluid  collection. Pituitary gland suprasellar region normal. Midline structures intact and normal. Vascular: Major intracranial vascular flow voids are maintained. Skull and upper cervical spine: Craniocervical junction within normal limits. Bone marrow signal intensity normal. No scalp soft tissue abnormality. Sinuses/Orbits: Globes and orbital soft tissues demonstrate no acute finding. Paranasal sinuses are largely clear. No mastoid effusion. Inner ear structures grossly normal. Other: None. IMPRESSION: 1. 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 2. Underlying age-related cerebral atrophy with moderate chronic microvascular ischemic disease, with multiple remote lacunar infarcts involving the hemispheric cerebral white matter, basal ganglia, thalami, and cerebellum. Electronically Signed   By: Rise Mu M.D.   On: 03/21/2021 23:17   DG CHEST PORT 1 VIEW  Result Date: 03/21/2021 CLINICAL DATA:  CVA. EXAM: PORTABLE CHEST 1 VIEW COMPARISON:  11/02/2012 FINDINGS: The cardiac silhouette is mildly enlarged with prior aortic valve replacement noted. A rounded calcified nodule projecting over the left mid lung is unchanged. No acute airspace consolidation, edema, sizable pleural effusion, pneumothorax is identified. No acute osseous abnormality is seen. Surgical clips are noted the thoracic inlet. IMPRESSION: No active disease. Electronically Signed   By: Sebastian Ache M.D.   On: 03/21/2021 17:27   ECHOCARDIOGRAM COMPLETE  Result Date: 03/22/2021    ECHOCARDIOGRAM  REPORT   Patient Name:   Joseph Padilla Date of Exam: 03/22/2021 Medical Rec #:  [id number]       Height:       71.0 in Accession #:    [id number]      Weight:       221.5 lb Date of Birth:  03-20-1941       BSA:          2.202 m Patient Age:    89 years        BP:           163/91 mmHg Patient Gender: M               HR:           91 bpm. Exam Location:  Inpatient Procedure: 2D Echo, Cardiac Doppler and Color  Doppler Indications:    Stroke I63.9  History:        Patient has no prior history of Echocardiogram examinations. 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  Sonographer:    Roosvelt Maser RDCS Referring Phys: [id number] RONDELL A SMITH IMPRESSIONS  1. 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 Aortic valve regurgitation is not visualized. Aortic valve mean gradient measures 24.0 mmHg. Aortic valve Vmax measures 3.15 m/s. Aortic valve acceleration time measures 100 msec. Suboptimal LVOT Assessment for DVI. Slight increase in prosthetic valve gradients from expected.  2. The mitral valve is abnormal. Mild, eccentric mitral valve regurgitation. The mean mitral valve gradient is 4.0 mmHg with average heart rate of 82 bpm.  3. Left ventricular ejection fraction, by estimation, is 50 to 55%. The left ventricle has low normal function. The left ventricle has no regional wall motion abnormalities. Left ventricular diastolic parameters are indeterminate.  4. Left atrial size was severely dilated.  5. Right ventricular systolic function is normal. The right ventricular size is mildly enlarged. Comparison(s): Prior images unable to be directly viewed, comparison made by report only. Compared with OSH study, slight increase in aortic and mitral gradients. FINDINGS  Left Ventricle: Left ventricular ejection fraction, by estimation, is 50 to 55%. The left ventricle has low normal function. The left ventricle has no regional wall motion abnormalities. The left ventricular internal cavity size was normal in size. There is no left ventricular hypertrophy. Left ventricular diastolic parameters are indeterminate. Right Ventricle: The right ventricular size is mildly enlarged. No increase in right ventricular wall thickness. Right ventricular systolic function is normal. Left Atrium: Left atrial size was severely dilated. Right Atrium: Right atrial size was normal in size.  Pericardium: There is no evidence of pericardial effusion. Mitral Valve: The mitral valve is abnormal. There is mild calcification of the mitral valve leaflet(s). Mild mitral annular calcification. Mild mitral valve regurgitation. The mean mitral valve gradient is 4.0 mmHg with average heart rate of 82 bpm. Tricuspid Valve: The tricuspid valve is grossly normal. Tricuspid valve regurgitation is not demonstrated. No evidence of tricuspid stenosis. Aortic Valve: The aortic valve has been repaired/replaced. Aortic valve regurgitation is not visualized. Aortic valve mean gradient measures 24.0 mmHg. Aortic valve peak gradient measures 39.7 mmHg. Aortic valve area, by VTI measures 0.65 cm. Pulmonic Valve: The pulmonic valve was not well visualized. Pulmonic valve regurgitation is trivial. No evidence of pulmonic stenosis. Aorta: The aortic root and ascending aorta are structurally normal, with no evidence of dilitation. These  measuresments are within normal limits for age and body surface area. IAS/Shunts: The atrial septum is grossly normal.  LEFT VENTRICLE PLAX 2D LVIDd:         5.10 cm  Diastology LVIDs:         3.70 cm  LV e' medial:    4.90 cm/s LV PW:         1.00 cm  LV E/e' medial:  19.3 LV IVS:        1.00 cm  LV e' lateral:   6.53 cm/s LVOT diam:     2.20 cm  LV E/e' lateral: 14.5 LV SV:         42 LV SV Index:   19 LVOT Area:     3.80 cm  RIGHT VENTRICLE RV Basal diam:  4.80 cm RV Mid diam:    3.50 cm RV S prime:     9.57 cm/s TAPSE (M-mode): 2.3 cm LEFT ATRIUM              Index       RIGHT ATRIUM           Index LA diam:        4.80 cm  2.18 cm/m  RA Area:     17.40 cm LA Vol (A2C):   98.8 ml  44.87 ml/m RA Volume:   48.80 ml  22.16 ml/m LA Vol (A4C):   142.0 ml 64.48 ml/m LA Biplane Vol: 123.0 ml 55.86 ml/m  AORTIC VALVE AV Area (Vmax):    0.63 cm AV Area (Vmean):   0.61 cm AV Area (VTI):     0.65 cm AV Vmax:           315.00 cm/s AV Vmean:          234.000 cm/s AV VTI:            0.640 m AV Peak  Grad:      39.7 mmHg AV Mean Grad:      24.0 mmHg LVOT Vmax:         52.20 cm/s LVOT Vmean:        37.400 cm/s LVOT VTI:          0.110 m LVOT/AV VTI ratio: 0.17  AORTA Ao Root diam: 3.80 cm Ao Asc diam:  3.50 cm MITRAL VALVE MV Area (PHT): 5.02 cm     SHUNTS MV Mean grad:  4.0 mmHg     Systemic VTI:  0.11 m MV Decel Time: 151 msec     Systemic Diam: 2.20 cm MV E velocity: 94.50 cm/s MV A velocity: 149.00 cm/s MV E/A ratio:  0.63 Riley Lam MD Electronically signed by Riley Lam MD Signature Date/Time: 03/22/2021/2:20:06 PM    Final    VAS US CAROTID  Result Date: 03/22/2021 Carotid Arterial Duplex Study Patient Name:  VELMA HANNA  Date of Exam:   03/22/2021 Medical Rec #: [id number]        Accession #:    [id number] Date of Birth: 02-20-1941        Patient Gender: M Patient Age:   80Y Exam Location:  [facility] Procedure:      VAS US CAROTID Referring Phys: [id number] Reyne Dumas [facility] --------------------------------------------------------------------------------  Indications:       CVA. Risk Factors:      Diabetes. Comparison Study:  No prior studies. Performing Technologist: Chanda Busing RVT  Examination Guidelines: A complete evaluation includes B-mode imaging, spectral Doppler, color Doppler, and power Doppler as needed  of all accessible portions of each vessel. Bilateral testing is considered an integral part of a complete examination. Limited examinations for reoccurring indications may be performed as noted.  Right Carotid Findings: +----------+--------+--------+--------+---------------------+------------------+           PSV cm/sEDV cm/sStenosisPlaque Description   Comments           +----------+--------+--------+--------+---------------------+------------------+ CCA Prox  88      4               smooth and           High resistant                                       heterogenous         flow                +----------+--------+--------+--------+---------------------+------------------+ CCA Distal84      9               smooth and           High resistant                                       heterogenous         flow               +----------+--------+--------+--------+---------------------+------------------+ ICA Prox  72      14              smooth and                                                                heterogenous                            +----------+--------+--------+--------+---------------------+------------------+ ICA Distal47      7                                    tortuous           +----------+--------+--------+--------+---------------------+------------------+ ECA       100     0                                                       +----------+--------+--------+--------+---------------------+------------------+ +----------+--------+-------+--------+-------------------+           PSV cm/sEDV cmsDescribeArm Pressure (mmHG) +----------+--------+-------+--------+-------------------+ [id number]                                         +----------+--------+-------+--------+-------------------+ +---------+--------+--+--------+-+----------------------------+ VertebralPSV cm/s61EDV cm/s9Antegrade and High resistant +---------+--------+--+--------+-+----------------------------+  Left Carotid Findings: +----------+--------+--------+--------+---------------------+------------------+           PSV cm/sEDV cm/sStenosisPlaque Description   Comments           +----------+--------+--------+--------+---------------------+------------------+  CCA Prox  72      8               smooth and           High resistant                                       heterogenous         flow noted in the                                                         proximal CCA.       +----------+--------+--------+--------+---------------------+------------------+ CCA Distal78      6               smooth and           High resistant                                       heterogenous         flow               +----------+--------+--------+--------+---------------------+------------------+ ICA Prox  57      8               smooth and           High resistant                                       heterogenous         flow               +----------+--------+--------+--------+---------------------+------------------+ ICA Distal85      13                                   tortuous           +----------+--------+--------+--------+---------------------+------------------+ ECA       93                                                              +----------+--------+--------+--------+---------------------+------------------+ +----------+--------+--------+--------+-------------------+           PSV cm/sEDV cm/sDescribeArm Pressure (mmHG) +----------+--------+--------+--------+-------------------+ [id number]                                         +----------+--------+--------+--------+-------------------+ +---------+--------+--+--------+-+---------+ VertebralPSV cm/s48EDV cm/s9Antegrade +---------+--------+--+--------+-+---------+   Summary: Right Carotid: Velocities in the right ICA are consistent with a 1-39% stenosis. Left Carotid: Velocities in the left ICA are consistent with a 1-39% stenosis. Vertebrals: Left vertebral artery demonstrates antegrade flow. Right vertebral             artery demonstrates high resistant flow. *See table(s) above  for measurements and observations.  Electronically signed by Delia Heady MD on 03/22/2021 at 5:37:02 PM.    Final      PERTINENT LAB RESULTS: CBC: Recent Labs    03/21/21 0905 03/22/21 0232  WBC 10.7* 8.8  HGB 16.4 15.1  HCT 48.4 43.5  PLT 252 210   CMET CMP     Component Value  Date/Time   NA 138 03/23/2021 0323   K 3.5 03/23/2021 0323   CL 102 03/23/2021 0323   CO2 21 (L) 03/23/2021 0323   GLUCOSE 195 (H) 03/23/2021 0323   BUN 11 03/23/2021 0323   CREATININE 0.82 03/23/2021 0323   CALCIUM 8.6 (L) 03/23/2021 0323   PROT 7.0 03/21/2021 0905   ALBUMIN 4.2 03/21/2021 0905   AST 28 03/21/2021 0905   ALT 25 03/21/2021 0905   ALKPHOS 70 03/21/2021 0905   BILITOT 1.0 03/21/2021 0905   GFRNONAA >60 03/23/2021 0323   GFRAA 62 (L) 11/02/2012 1357    GFR Estimated Creatinine Clearance: 89.7 mL/min (by C-G formula based on SCr of 0.82 mg/dL). No results for input(s): LIPASE, AMYLASE in the last 72 hours. No results for input(s): CKTOTAL, CKMB, CKMBINDEX, TROPONINI in the last 72 hours. Invalid input(s): POCBNP No results for input(s): DDIMER in the last 72 hours. Recent Labs    03/21/21 1631  HGBA1C 7.9*   Recent Labs    03/21/21 1631  CHOL 108  HDL 30*  LDLCALC 58  TRIG 98  CHOLHDL 3.6   No results for input(s): TSH, T4TOTAL, T3FREE, THYROIDAB in the last 72 hours.  Invalid input(s): FREET3 No results for input(s): VITAMINB12, FOLATE, FERRITIN, TIBC, IRON, RETICCTPCT in the last 72 hours. Coags: Recent Labs    03/22/21 0232 03/23/21 0323  INR 4.6* 3.6*   Microbiology: Recent Results (from the past 240 hour(s))  Resp Panel by RT-PCR (Flu A&B, Covid) Nasopharyngeal Swab     Status: None   Collection Time: 03/21/21  9:18 AM   Specimen: Nasopharyngeal Swab; Nasopharyngeal(NP) swabs in vial transport medium  Result Value Ref Range Status   SARS Coronavirus 2 by RT PCR NEGATIVE NEGATIVE Final    Comment: (NOTE) SARS-CoV-2 target nucleic acids are NOT DETECTED.  The SARS-CoV-2 RNA is generally detectable in upper respiratory specimens during the acute phase of infection. The lowest concentration of SARS-CoV-2 viral copies this assay can detect is 138 copies/mL. A negative result does not preclude SARS-Cov-2 infection and should not be used as  the sole basis for treatment or other patient management decisions. A negative result may occur with  improper specimen collection/handling, submission of specimen other than nasopharyngeal swab, presence of viral mutation(s) within the areas targeted by this assay, and inadequate number of viral copies(<138 copies/mL). A negative result must be combined with clinical observations, patient history, and epidemiological information. The expected result is Negative.  Fact Sheet for Patients:  BloggerCourse.com  Fact Sheet for Healthcare Providers:  SeriousBroker.it  This test is no t yet approved or cleared by the [place] FDA and  has been authorized for detection and/or diagnosis of SARS-CoV-2 by FDA under an Emergency Use Authorization (EUA). This EUA will remain  in effect (meaning this test can be used) for the duration of the COVID-19 declaration under Section 564(b)(1) of the Act, 21 U.S.C.section 360bbb-3(b)(1), unless the authorization is terminated  or revoked sooner.       Influenza A by PCR NEGATIVE NEGATIVE Final   Influenza B by PCR NEGATIVE NEGATIVE Final    Comment: (  NOTE) The Xpert Xpress SARS-CoV-2/FLU/RSV plus assay is intended as an aid in the diagnosis of influenza from Nasopharyngeal swab specimens and should not be used as a sole basis for treatment. Nasal washings and aspirates are unacceptable for Xpert Xpress SARS-CoV-2/FLU/RSV testing.  Fact Sheet for Patients: BloggerCourse.com  Fact Sheet for Healthcare Providers: SeriousBroker.it  This test is not yet approved or cleared by the [place] FDA and has been authorized for detection and/or diagnosis of SARS-CoV-2 by FDA under an Emergency Use Authorization (EUA). This EUA will remain in effect (meaning this test can be used) for the duration of the COVID-19 declaration under Section 564(b)(1) of  the Act, 21 U.S.C. section 360bbb-3(b)(1), unless the authorization is terminated or revoked.  Performed at [facility], [address] [place], [place] [address]     FURTHER DISCHARGE INSTRUCTIONS:  Get Medicines reviewed and adjusted: Please take all your medications with you for your next visit with your Primary MD  Laboratory/radiological data: Please request your Primary MD to go over all hospital tests and procedure/radiological results at the follow up, please ask your Primary MD to get all Hospital records sent to his/her office.  In some cases, they will be blood work, cultures and biopsy results pending at the time of your discharge. Please request that your primary care M.D. goes through all the records of your hospital data and follows up on these results.  Also Note the following: If you experience worsening of your admission symptoms, develop shortness of breath, life threatening emergency, suicidal or homicidal thoughts you must seek medical attention immediately by calling 911 or calling your MD immediately  if symptoms less severe.  You must read complete instructions/literature along with all the possible adverse reactions/side effects for all the Medicines you take and that have been prescribed to you. Take any new Medicines after you have completely understood and accpet all the possible adverse reactions/side effects.   Do not drive when taking Pain medications or sleeping medications (Benzodaizepines)  Do not take more than prescribed Pain, Sleep and Anxiety Medications. It is not advisable to combine anxiety,sleep and pain medications without talking with your primary care practitioner  Special Instructions: If you have smoked or chewed Tobacco  in the last 2 yrs please stop smoking, stop any regular Alcohol  and or any Recreational drug use.  Wear Seat belts while driving.  Please note: You were cared for by a hospitalist during your hospital  stay. Once you are discharged, your primary care physician will handle any further medical issues. Please note that NO REFILLS for any discharge medications will be authorized once you are discharged, as it is imperative that you return to your primary care physician (or establish a relationship with a primary care physician if you do not have one) for your post hospital discharge needs so that they can reassess your need for medications and monitor your lab values.  Total Time spent coordinating discharge including counseling, education and face to face time equals 35 minutes.  SignedJeoffrey Massed 03/23/2021 9:51 AM

## 2021-05-05 ENCOUNTER — Ambulatory Visit: Payer: Medicare Other | Admitting: Adult Health

## 2021-05-05 ENCOUNTER — Encounter: Payer: Self-pay | Admitting: Adult Health

## 2021-05-05 VITALS — BP 160/90 | HR 90 | Ht 72.0 in | Wt 205.0 lb

## 2021-05-05 DIAGNOSIS — E785 Hyperlipidemia, unspecified: Secondary | ICD-10-CM | POA: Diagnosis not present

## 2021-05-05 DIAGNOSIS — I639 Cerebral infarction, unspecified: Secondary | ICD-10-CM | POA: Diagnosis not present

## 2021-05-05 DIAGNOSIS — I10 Essential (primary) hypertension: Secondary | ICD-10-CM | POA: Diagnosis not present

## 2021-05-05 DIAGNOSIS — E1169 Type 2 diabetes mellitus with other specified complication: Secondary | ICD-10-CM

## 2021-05-05 DIAGNOSIS — I69322 Dysarthria following cerebral infarction: Secondary | ICD-10-CM | POA: Diagnosis not present

## 2021-05-05 DIAGNOSIS — E669 Obesity, unspecified: Secondary | ICD-10-CM

## 2021-05-05 NOTE — Progress Notes (Addendum)
[facility] [address] [place]. [place] [address] [phone]       HOSPITAL FOLLOW UP NOTE  [name] Date of Birth:  07-Feb-1941 Medical Record Number:  [id number]   Reason for Referral:  hospital stroke follow up    SUBJECTIVE:   CHIEF COMPLAINT:  Chief Complaint  Patient presents with   Follow-up    Rm  2 alone Pt is well, just having some slurred speech. Overall doing well.     HPI:   [name] is a 80 y.o. 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 No tPA given due to outside window.  Personally reviewed hospitalization pertinent progress notes, lab work and imaging.  Evaluated by Dr. 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 MRI also showed multiple remote lacunar infarcts.  MRA head and CUS unremarkable.  EF 50 to 55%.  LDL 58.  A1c 7.9.  Advised to continue warfarin with INR goal 2.5-3.5 and continuation of Crestor and Zetia.  Therapies recommended HH PT/OT/SLP  Today, 05/05/2021, Joseph Padilla is being seen for hospital follow-up unaccompanied.  Reports residual slurred speech but overall improving.  Denies residual gait difficulty or weakness.  Denies new stroke/TIA symptoms.  Has returned back to all prior activities without difficulty.  Compliant on warfarin, Crestor and Zetia without side effects.  INR levels routinely monitored by cardiology.  Blood pressure today 160/90 but has not yet taken AM BP meds.  He does not routinely monitor at home.  No concerns at this time.     Pertinent imaging  CT head IMPRESSION: 1. No evidence of an acute large territory infarct or intracranial hemorrhage. 2. Moderate chronic small vessel ischemic disease with age indeterminate lacunar infarcts in the basal ganglia/deep white matter bilaterally. 3. Small chronic left cerebellar infarct.  MR brain  IMPRESSION: 1.  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 2. Underlying age-related cerebral atrophy with moderate chronic microvascular ischemic disease, with multiple remote lacunar infarcts involving the hemispheric cerebral white matter, basal ganglia, thalami, and cerebellum.  MRA head IMPRESSION: No large or medium vessel occlusion is visible. No correctable proximal stenosis. Patient does show some atherosclerotic irregularity of the more distal branch vessels.  CAROTID DUPLEX Bilateral ICA 1 to 39% stenosis VAs antegrade  2D echo IMPRESSIONS   1. The aortic valve has been repaired/replaced with a mechanical aortic  valve. Aortic valve regurgitation is not visualized. Aortic valve mean  gradient measures 24.0 mmHg. Aortic valve Vmax measures 3.15 m/s. Aortic  valve acceleration time measures 100  msec. Suboptimal LVOT Assessment for DVI. Slight increase in prosthetic  valve gradients from expected.   2. The mitral valve is abnormal. Mild, eccentric mitral valve  regurgitation. The mean mitral valve gradient is 4.0 mmHg with average  heart rate of 82 bpm.   3. Left ventricular ejection fraction, by estimation, is 50 to 55%. The  left ventricle has low normal function. The left ventricle has no regional  wall motion abnormalities. Left ventricular diastolic parameters are  indeterminate.   4. Left atrial size was severely dilated.   5. Right ventricular systolic function is normal. The right ventricular  size is mildly enlarged.       ROS:   14 system review of systems performed and negative with exception of those listed in HPI  PMH:  Past Medical History:  Diagnosis Date   Diabetes mellitus type 2 in obese (HCC)    History of mechanical aortic valve replacement    valve    Hypertension     PSH:  Past Surgical History:  Procedure Laterality Date   APPENDECTOMY     BACK SURGERY     VALVE REPLACEMENT      Social History:  Social  History   Socioeconomic History   Marital status: Married    Spouse name: Not on file   Number of children: Not on file   Years of education: Not on file   Highest education level: Not on file  Occupational History   Not on file  Tobacco Use   Smoking status: Never   Smokeless tobacco: Never  Vaping Use   Vaping Use: Never used  Substance and Sexual Activity   Alcohol use: Never   Drug use: Never   Sexual activity: Not on file  Other Topics Concern   Not on file  Social History Narrative   Not on file   Social Determinants of Health   Financial Resource Strain: Not on file  Food Insecurity: Not on file  Transportation Needs: Not on file  Physical Activity: Not on file  Stress: Not on file  Social Connections: Not on file  Intimate Partner Violence: Not on file    Family History: History reviewed. No pertinent family history.  Medications:   Current Outpatient Medications on File Prior to Visit  Medication Sig Dispense Refill   ezetimibe (ZETIA) 10 MG tablet Take 10 mg by mouth daily.     glimepiride (AMARYL) 4 MG tablet Take 4 mg by mouth daily before breakfast.     lisinopril-hydrochlorothiazide (PRINZIDE,ZESTORETIC) 20-12.5 MG per tablet Take 1 tablet by mouth daily.     metFORMIN (GLUCOPHAGE) 1000 MG tablet Take 1,000 mg by mouth 2 (two) times daily with a meal.     metoprolol (LOPRESSOR) 100 MG tablet Take 100 mg by mouth daily.     rosuvastatin (CRESTOR) 20 MG tablet Take 20 mg by mouth daily.     warfarin (COUMADIN) 7.5 MG tablet Take 1 tablet (7.5 mg total) by mouth at bedtime.     No current facility-administered medications on file prior to visit.    Allergies:  No Known Allergies    OBJECTIVE:  Physical Exam  Vitals:   05/05/21 1057  BP: (!) 160/90  Pulse: 90  Weight: 205 lb (93 kg)  Height: 6' (1.829 m)   Body mass index is 27.8 kg/m. No results found.  Post stroke PHQ 2/9 Depression screen PHQ 2/9 05/05/2021  Decreased Interest 0   Down, Depressed, Hopeless 0  PHQ - 2 Score 0     General: well developed, well nourished, very pleasant elderly Caucasian male, seated, in no evident distress Head: head normocephalic and atraumatic.   Neck: supple with no carotid or supraclavicular bruits Cardiovascular: regular rate and rhythm, no murmurs Musculoskeletal: no deformity Skin:  no rash/petichiae Vascular:  Normal pulses all extremities   Neurologic Exam Mental Status: Awake and fully alert.  Mild dysarthria.  No evidence of aphasia.  Oriented to place and time. Recent and remote memory intact. Attention span, concentration and fund of knowledge appropriate. Mood and affect appropriate.  Cranial Nerves: Fundoscopic exam reveals sharp disc margins. Pupils equal, briskly reactive to light. Extraocular movements full without nystagmus. Visual fields full to confrontation. Hearing intact. Facial sensation intact.  Right lower facial weakness.  Tongue, palate moves normally and symmetrically.  Motor:  Normal bulk and tone. Normal strength in all tested extremity muscles Sensory.: intact to touch , pinprick , position and vibratory sensation.  Coordination: Rapid alternating movements normal in all extremities. Finger-to-nose and heel-to-shin performed accurately bilaterally. Gait and Station: Arises from chair without difficulty. Stance is normal. Gait demonstrates normal stride length and balance without use of assistive device. Tandem walk and heel toe moderate difficulty.  Reflexes: 1+ and symmetric. Toes downgoing.     NIHSS  1 Modified Rankin  1      ASSESSMENT: Joseph Padilla is a 80 y.o. 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  Vascular risk factors include HTN, HLD, DM, AV mechanical valve 2001 on warfarin.      PLAN:  L BG/CR stroke :  Residual deficit: Mild dysarthria with continued  improvement Continue warfarin daily  and Crestor and Zetia for secondary stroke prevention.  Discussed secondary stroke prevention measures and importance of close PCP follow up for aggressive stroke risk factor management. I have gone over the pathophysiology of stroke, warning signs and symptoms, risk factors and their management in some detail with instructions to go to the closest emergency room for symptoms of concern. HTN: BP goal <130/90.  Elevated today possibly in setting of not taking AM BP meds.  Discussed importance of routinely monitoring blood pressure at home and to follow-up with PCP/cardiology if remains elevated HLD: LDL goal <70. Recent LDL 58 on Crestor 20 mg daily and Zetia 10 mg daily per PCP.  DMII: A1c goal<7.0. Recent A1c 7.9.  On metformin and glimepiride per PCP AV mechanical heart valve: On warfarin with INR goal 2.5-3.5 routinely monitored by cardiology    Follow up in 6 months or call earlier if needed   CC:  [facility] provider: Dr. Pearlean Brownie PCP: Joseph Padilla., PA-C    I spent 46 minutes of face-to-face and non-face-to-face time with patient.  This included previsit chart review including review of recent hospitalization, lab review, study review, electronic health record documentation, patient education and discussion regarding recent stroke and etiology, secondary stroke prevention measures and aggressive stroke risk factor management, residual deficits and typical recovery time and answered all questions to patient satisfaction  Ihor Austin, AGNP-BC  [facility] [address] [place], [place] [address]  Phone [phone] Fax [phone] Note: This document was prepared with digital dictation and possible smart phrase technology. Any transcriptional errors that result from this process are unintentional.

## 2021-05-05 NOTE — Patient Instructions (Signed)
Continue warfarin daily  and Crestor and Zetia for secondary stroke prevention  Continue to follow with cardiology for warfarin and INR level monitoring  Continue to follow up with PCP regarding cholesterol and blood pressure management  Maintain strict control of hypertension with blood pressure goal below 130/90 and cholesterol with LDL cholesterol (bad cholesterol) goal below 70 mg/dL.       Followup in the future with me in 6 months or call earlier if needed       Thank you for coming to see [place] at [facility]. I hope we have been able to provide you high quality care today.  You may receive a patient satisfaction survey over the next few weeks. We would appreciate your feedback and comments so that we may continue to improve ourselves and the health of our patients.

## 2021-05-10 NOTE — Progress Notes (Signed)
I agree with the above plan 

## 2021-11-04 ENCOUNTER — Ambulatory Visit: Payer: Medicare Other | Admitting: Adult Health

## 2022-12-10 IMAGING — MR MR MRA HEAD W/O CM
2 series · 19 of 48 positions shown · non-contrast
Comparison: Brain MRI yesterday.

CLINICAL DATA: Slurred speech and right facial droop. Left basal
ganglia infarction.

EXAM:
MRA HEAD WITHOUT CONTRAST
TECHNIQUE: Angiographic images of the Circle of Willis were acquired using MRA
technique without intravenous contrast.

[Series 2: (id) mt fs · axial · 1.4mm · 0.43mm/px · z∈[+8,+115]mm · 18 of 160 slices shown]
[im 1/160]
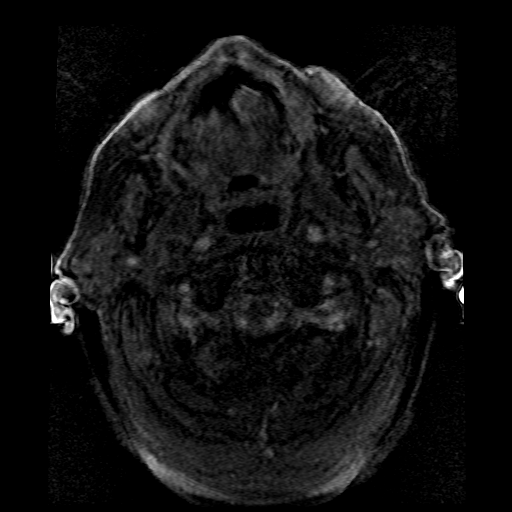
[im 4/160]
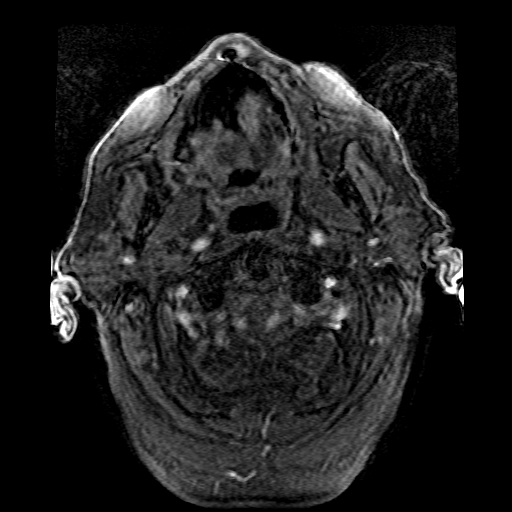
[im 7/160]
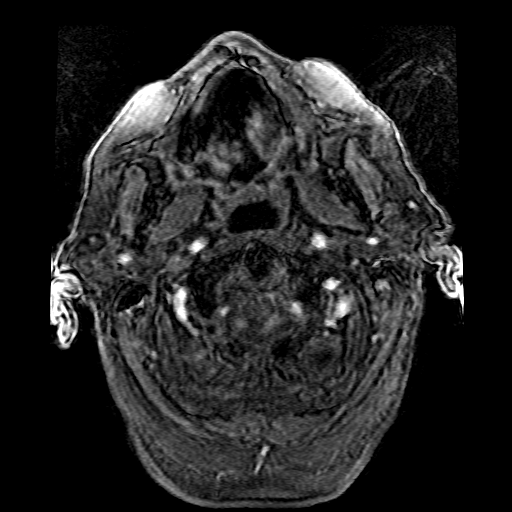
[im 11/160]
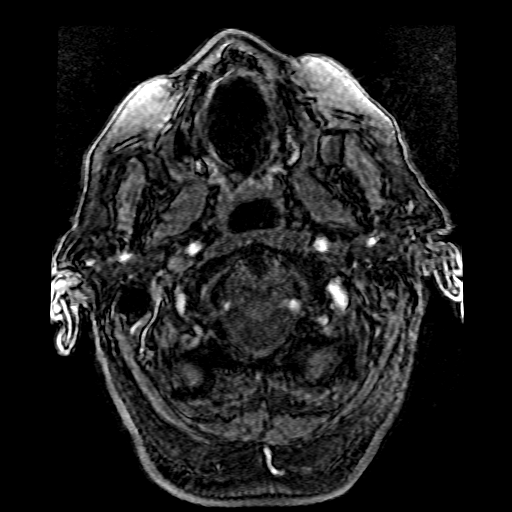
[im 14/160]
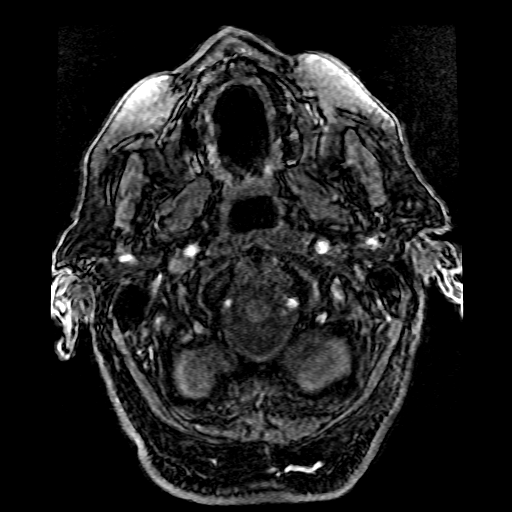
[im 18/160]
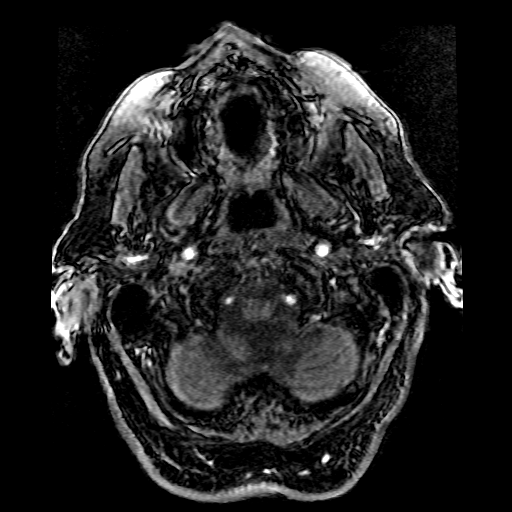
[im 21/160]
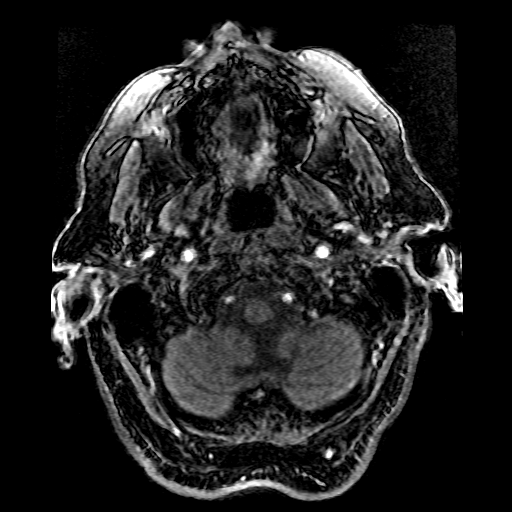
[im 25/160]
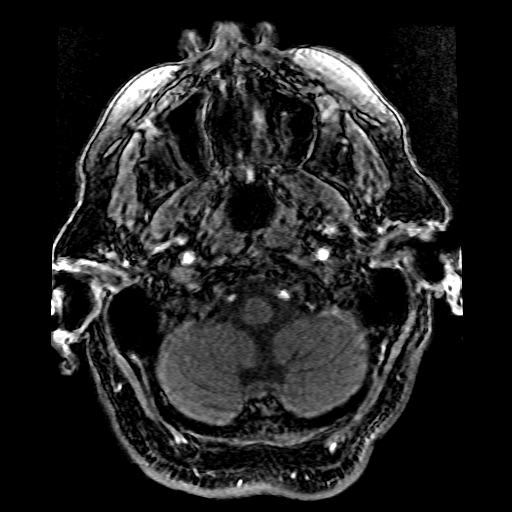
[im 28/160]
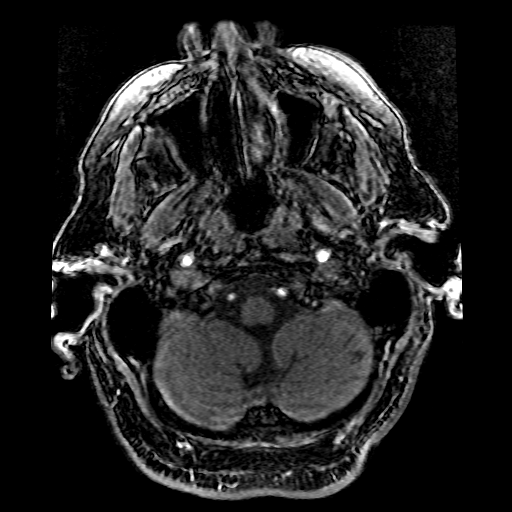
[im 32/160]
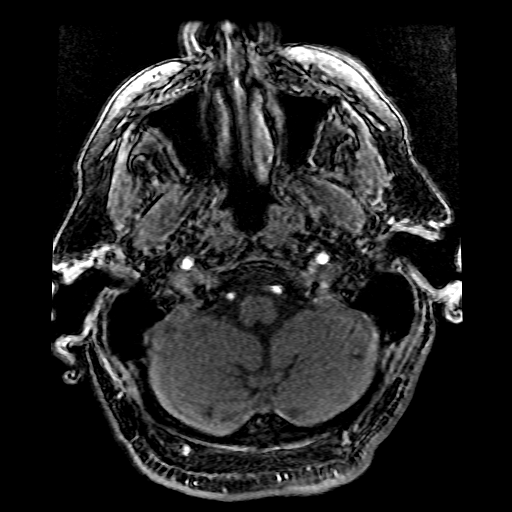
[im 49/160]
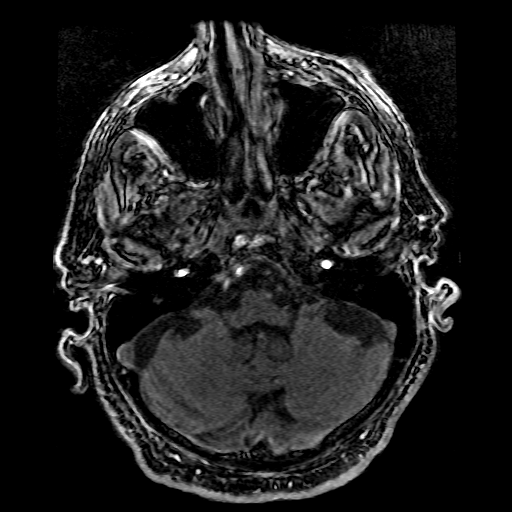
[im 70/160]
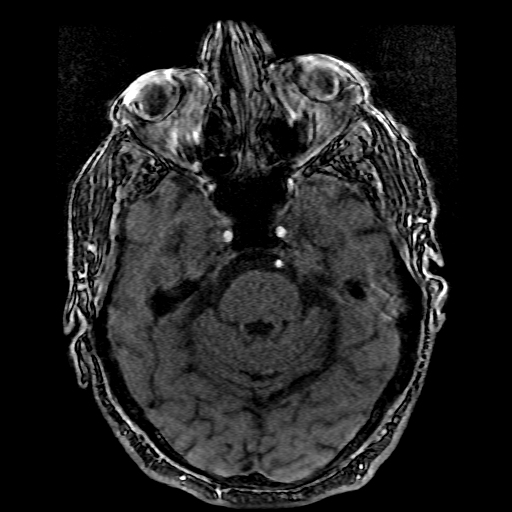
[im 80/160]
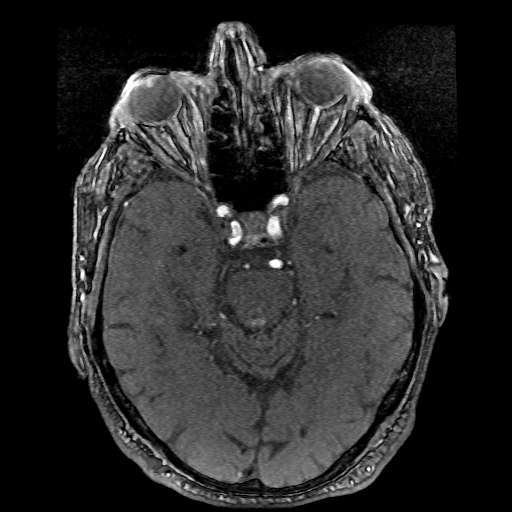
[im 90/160]
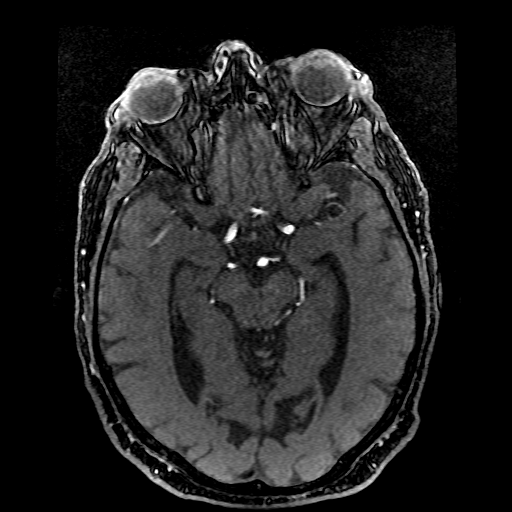
[im 111/160]
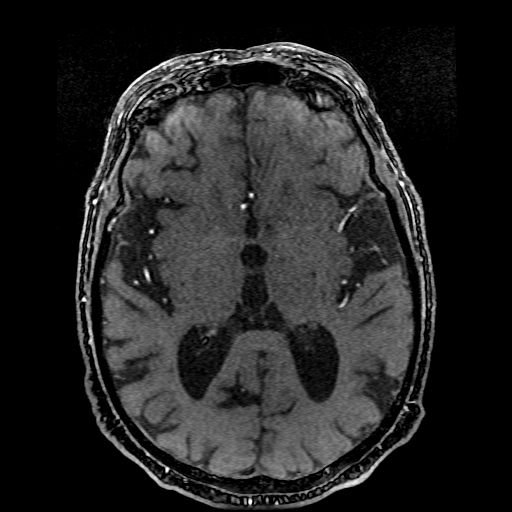
[im 132/160]
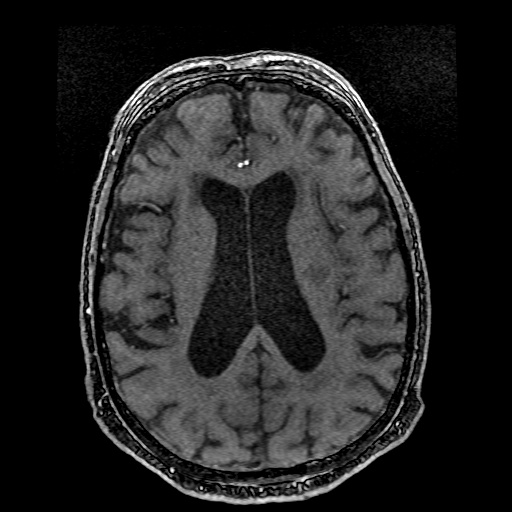
[im 135/160]
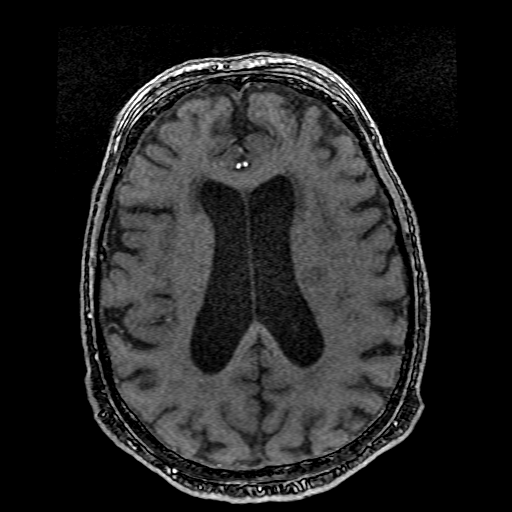
[im 153/160]
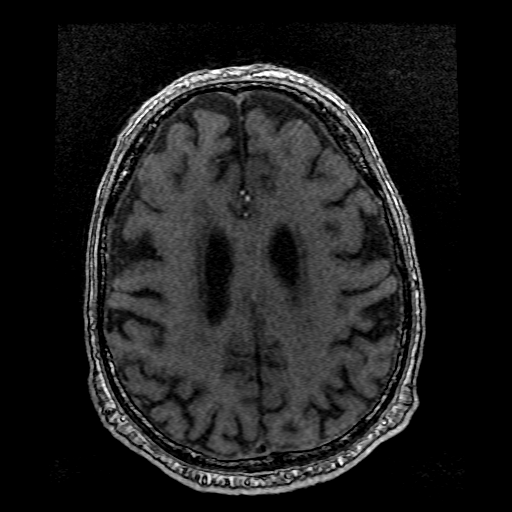

[Series 200: col:(id) mt fs · axial · 1.4mm · 0.43mm/px · 1 of 1 slices shown]
[im 1/1]
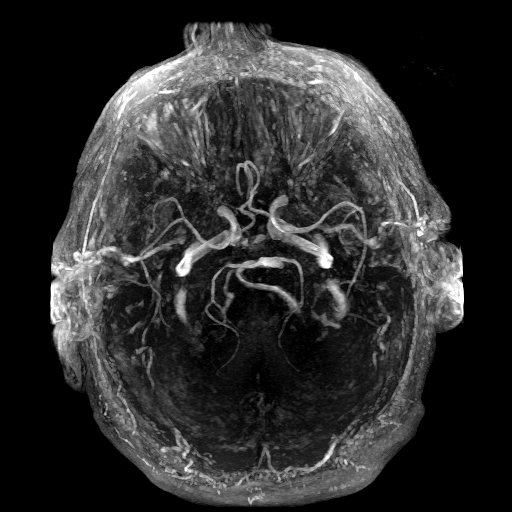

[19 of 48 positions shown; findings below may reference images not displayed]

FINDINGS: Anterior circulation: Both internal carotid arteries are widely
patent through the skull base and siphon regions. The anterior and
middle cerebral vessels are patent without proximal stenosis or
large or medium branch vessel occlusion. No aneurysm or vascular
malformation. There is mild atherosclerotic irregularity of the more
distal MCA branches.

Posterior circulation: Both vertebral arteries widely patent to the
basilar. No basilar stenosis. Posterior circulation branch vessels
show flow. Mild atherosclerotic irregularity of the PCA branches.

Anatomic variants: None significant.

Other: None
IMPRESSION: No large or medium vessel occlusion is visible. No correctable
proximal stenosis. Patient does show some atherosclerotic
irregularity of the more distal branch vessels.

## 2024-09-27 NOTE — H&P (Signed)
 " Hospitalist Admission History and Physical   Chief Complaint  Atrial fibrillation with RVR   HPI   Joseph Padilla was admitted from the CDU for possible new CHF. His oxygenation remained stable, and an echocardiogram and cardiology consult were planned. He subsequently developed atrial fibrillation with rapid ventricular response (rate in the 160s). He remained asymptomatic with stable blood pressure. Due to inability to administer medications on the floor, he was transferred back to ED room 14. There is uncertainty regarding whether this is new-onset atrial fibrillation, as the patient reports a prior diagnosis and is on Coumadin  for atrial fibrillation and valve replacement per Coumadin  clinic documentation, though this is not reflected in his physician's note or prior EKGs. Cardiology has been consulted and will evaluate him tomorrow. At this time the patient denies experiencing chest pain at.  He did state that shortness of breath and palpitations were felt earlier.  Since his arrival in the ED he has been given a dose of metoprolol  5 mg IV.  There was a subsequent reduction in his heart rate as well as his blood pressure.  Patient was then later placed on diltiazem infusion and blood pressure subsequently improved.  At this time he is lying on the gurney with no complaints.  He denies having nausea, dizziness or anxiety.  There are no complaints of constitutional symptoms.   Old records reviewed in assessment of this patient. Care of this patient was also discussed with ER physician/provider prior to assuming care.   Assessment and Plan  Atrial fibrillation with rapid ventricular response - Patient already takes warfarin because of aortic valve replacement.  Warfarin will be continued.  Pharmacy consultation for warfarin management placed.  Rate control at this time. - Consider adding TSH level as an add-on to current lab samples.  New onset congestive heart failure Bilateral pleural  effusions - Transthoracic echocardiogram and requested. - Daily weight and monitor electrolytes - Strict input and output, low-sodium diet, patient education relative to heart failure management. - Furosemide   Diabetes mellitus type 2 - This will be managed with insulin  during this hospitalization.  Goal for blood sugars between 140 and 180.  Correction scale insulin  has been ordered as well as hypoglycemic protocol.  The plan of care was explained to the patient he acknowledged understanding.    Code Status: Patient/surrogate desires Full Code after discussion on the day of admission.    DVT Prophylaxis: PPX not indicated due to systemic anticoagulation  Anticipated disposition is to Home in 2-3 days.    History  Past Medical and Surgical History Medical History[1]  Surgical History[2]  Family History Family History[3]  Social History Patient denies alcohol use. Former tobacco smoker.   Allergies  Allergies[4]   Home Medications  Prior to Admission medications  Medication Sig Start Date End Date Taking? Authorizing Provider  ezetimibe  (ZETIA ) 10 mg tablet Take 1 tablet (10 mg total) by mouth daily Indications: excessive fat in the blood. 11/08/23  Yes Leotis Sella, NP  lisinopriL-hydroCHLOROthiazide (PRINZIDE) 20-12.5 mg per tablet Take 1 tablet by mouth daily. 11/08/23  Yes Leotis Sella, NP  metFORMIN (GLUCOPHAGE) 1,000 mg tablet Take 1 tablet (1,000 mg total) by mouth in the morning and 1 tablet (1,000 mg total) in the evening. Take with meals. 12/27/23  Yes Kristen Diane Kaplan, PA-C  metoprolol  tartrate (LOPRESSOR ) 100 mg tablet Take 1 tablet by mouth once daily Patient taking differently: Take 100 mg by mouth daily. 07/08/24  Yes Leotis Sella, NP  rosuvastatin  (CRESTOR ) 20  mg tablet Take 1 tablet (20 mg total) by mouth daily. 11/08/23  Yes Leotis Sella, NP  warfarin (COUMADIN ) 5 mg tablet Patient states that he takes 5mg s MWF and 7.5 mgs all  other days. 06/27/24  Yes Kristen Diane Kaplan, PA-C  warfarin (COUMADIN ) 1 mg tablet Patient states that he is taking 5 mg MWF and 7.5 mgs all other days. 12/27/23   Kristen Diane Kaplan, PA-C  warfarin (COUMADIN ) 3 mg tablet Patient states that he is taking 5 mgs MWF and 7.5 mgs all other days. 12/27/23   Josette Loa Aho, PA-C       Review of Systems    10 systems were reviewed and findings as mentioned in history of present illness and otherwise negative  Physical Exam  BP (!) 136/94   Pulse (!) 127   Temp 97.3 F (36.3 C) (Oral)   Resp 18   Ht 1.829 m (6')   Wt 81.3 kg (179 lb 3.2 oz)   SpO2 96%   BMI 24.30 kg/m  GEN: Pt is awake, alert, oriented x 4. Appears to be in NAD, resting comfortably on gurney.  HEENT: Normocephalic, atraumatic. Face symmetric. PERRLA. Mucosa moist. NECK: Supple, no JVD.  Cardiovascular/HEART: Irregularly irregular. S1, S2.  Systolic click, No edema noted. Bilateral radial and pedal pulses equal and palpable.  LUNGS: Crackles heard bilaterally in both lower lung fields.  No wheezes, rales, or rhonchi noted. Breathing is non-labored.  ABD: Positive bowel sounds x 4. Nontender, nondistended, no guarding or rebound tenderness noted. No masses palpated.  MUSCULOSKELETAL: No pain with voluntary ROM of bilateral upper or lower extremities.  NEURO: No gross neurological deficits noted. Sensation intact.  Gait is not assessed. PSYCH: Pleasant mood and appropriate affect.  SKIN: Warm, intact.      Labs and Results  I have reviewed the following labs and results: Recent Results (from the past 24 hours)  Troponin, High Sensitive (0 Hr + 2 Hr Rfx)   Collection Time: 09/27/24 11:04 AM  Result Value Ref Range   Troponin, High Sensitive 19 <20 ng/L  Comprehensive Metabolic Panel   Collection Time: 09/27/24 11:04 AM  Result Value Ref Range   Sodium 139 136 - 145 mmol/L   Potassium 4.5 3.4 - 4.5 mmol/L   Chloride 103 98 - 107 mmol/L   CO2 24 21 - 31 mmol/L    Anion Gap 12 6 - 14 mmol/L   Glucose, Random 170 (H) 70 - 99 mg/dL   Blood Urea Nitrogen (BUN) 21 7 - 25 mg/dL   Creatinine 8.85 9.29 - 1.30 mg/dL   eGFR 65 >40 fO/fpw/8.26f7   Albumin 4.1 3.5 - 5.7 g/dL   Total Protein 6.8 6.4 - 8.9 g/dL   Bilirubin, Total 0.9 0.3 - 1.0 mg/dL   Alkaline Phosphatase (ALP) 64 34 - 104 U/L   Aspartate Aminotransferase (AST) 34 13 - 39 U/L   Alanine Aminotransferase (ALT) 33 7 - 52 U/L   Calcium  9.2 8.6 - 10.3 mg/dL   BUN/Creatinine Ratio    B-Type Natriuretic Peptide (BNP)   Collection Time: 09/27/24 11:04 AM  Result Value Ref Range   B-Type Natriuretic Peptide (BNP) 385 (H) <100 pg/mL  CBC with Differential   Collection Time: 09/27/24 11:04 AM  Result Value Ref Range   WBC 7.18 4.40 - 11.00 10*3/uL   RBC 4.84 4.50 - 5.90 10*6/uL   Hemoglobin 14.6 14.0 - 17.5 g/dL   Hematocrit 56.8 58.4 - 50.4 %   Mean Corpuscular Volume (MCV)  89.1 80.0 - 96.0 fL   Mean Corpuscular Hemoglobin (MCH) 30.2 27.5 - 33.2 pg   Mean Corpuscular Hemoglobin Conc (MCHC) 33.9 33.0 - 37.0 g/dL   Red Cell Distribution Width (RDW) 14.6 12.3 - 17.0 %   Platelet Count (PLT) 205 150 - 450 10*3/uL   Mean Platelet Volume (MPV) 10.2 6.8 - 10.2 fL   Neutrophils % 81 %   Lymphocytes % 10 %   Monocytes % 7 %   Eosinophils % 1 %   Basophils % 1 %   Neutrophils Absolute 5.80 1.80 - 7.80 10*3/uL   Lymphocytes # 0.70 (L) 1.00 - 4.80 10*3/uL   Monocytes # 0.50 0.00 - 0.80 10*3/uL   Eosinophils # 0.10 0.00 - 0.50 10*3/uL   Basophils # 0.10 0.00 - 0.20 10*3/uL  Troponin, High Sensitive (2 Hr Rfx)   Collection Time: 09/27/24 12:57 PM  Result Value Ref Range   Troponin, High Sensitive 20 (HH) <20 ng/L  Prothrombin Time (PT) with INR   Collection Time: 09/27/24  6:31 PM  Result Value Ref Range   Prothrombin Time (PT) 28.1 (H) 11.8 - 14.4 seconds   International Normalized Ratio (INR) 2.6 (H) 0.0 - 1.5    Cultures: No results found for this visit on 09/27/24 (from the past 48  hours).  Radiology: Radiology Results (last 72 hours)     Procedure Component Value Units Date/Time   XR Chest 1 View [[id number]] Collected: 09/27/24 1341   Order Status: Completed Updated: 09/27/24 1411   Narrative:     XR CHEST 1 VIEW, 09/27/2024 11:30 AM  INDICATION:sob COMPARISON: None  FINDINGS: Supportive devices: None Cardiovascular/lungs/pleura: Enlarged cardiac silhouette. Aortic arch calcification. Small right more than left pleural effusions. Airspace opacity in the right lower lung. Other: Sternotomy changes. No acute fracture or suspicious osseous lesion.    Impression:     *  Small bilateral pleural effusions with adjacent atelectasis versus aspiration in the lower lungs. *  Cardiomegaly.         EKG:   Atrial fibrillation with RVR.  Rate 132 RBBB compared to ECG from January 2 at 9:57 AM there has been a change from sinus rhythm with PVCs and right bundle branch block to atrial fibrillation.        Electronically signed by: Stephens Fleta Parkin, MD 09/27/2024 7:15 PM        [1] Past Medical History: Diagnosis Date   Aortic valve replaced    Diabetes mellitus (CMD)    Hypercholesterolemia    Hypertension   [2] Past Surgical History: Procedure Laterality Date   AORTIC VALVE REPLACEMENT  2001   Procedure: AORTIC VALVE REPLACEMENT   APPENDECTOMY     Procedure: APPENDECTOMY   HAND SURGERY     Procedure: HAND SURGERY  [3] Family History Problem Relation Name Age of Onset   Diabetes Mother     Hypertension Mother     Thyroid disease Mother     Emphysema Father     Hypertension Father     Hyperlipidemia Brother     Cancer Sister     Stroke Maternal Grandfather    [4] No Known Allergies "

## 2024-09-27 NOTE — Consults (Signed)
 "  [facility] and Vascular Cardiology Consult  PCP: Josette Loa Aho, PA-C Primary Cardiologist:  Lynwood Landing, M.D. and Leotis Clover, NP  consulting Cardiologist:Thomas Isla Manner, MD  Portions of this note were dictated using DRAGON voice recognition software. Please disregard any errors in transcription.  This record has been created using Conservation officer, historic buildings. Errors have been sought and corrected, but may not always be located. Such creation errors do not reflect on the standard of medical care.  Active Problems: No Active Problems: There are no active problems currently on the Problem List. Please update the Problem List and refresh.   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  He is in atrial fibrillation with a rapid ventricular response which is apparently a new finding and may be contributing factor.  I would recommend continued diuresis to improve his volume status.  I would like him to have an echocardiogram tomorrow to assess his left ventricular systolic function and assess his mechanical aortic valve replacement.  Continue warfarin anticoagulation for his mechanical valve replacement and the atrial fibrillation.  His rate is somewhat fast in the atrial fibrillation.  I agree with continuing his metoprolol .  Consideration for the addition of diltiazem may need to be made to further improve the heart rate in the atrial fibrillation.  If he remains in atrial fibrillation we may consider antiarrhythmic therapy and attempts at restoration of sinus rhythm at some  point.    History of present Illness:     Joseph Padilla is a 84 y.o. male with a past medical history significant for hypertension, dyslipidemia and status post aortic valve replacement in 2001 with a mechanical aortic valve replacement who presented with a chief complaint of to the hospital today for evaluation of shortness of breath  The patient states over the last 2 to 3 weeks he has been experiencing increased shortness of breath.  He felt like he just could not get a deep breath and felt very short winded.  He felt a few palpitations but no sustained heart rhythm issues and denies any lower extremity swelling.  Denies chest pain.  With the progressive shortness of breath, he presented to the hospital.  In the ER, he is found has some evidence of pulmonary edema on chest x-ray as well as small pleural effusions.  He is found to be in atrial fibrillation with a rapid ventricular response and cardiology is consulted for recommendations.  The patient's medical history is remarkable for undergoing aortic valve replacement in 2001 with a mechanical prosthesis.  He has been maintained on warfarin anticoagulation since that time.  His medical history is otherwise remarkable for hypertension and dyslipidemia.    He is a former smoker \   Allergies[1]  Medications:  Prior to Admission medications  Medication Sig Start Date End Date Taking? Authorizing Provider  ezetimibe  (ZETIA ) 10 mg tablet Take 1 tablet (10 mg total) by mouth daily Indications: excessive fat in the blood. 11/08/23  Yes Leotis Sella, NP  lisinopriL-hydroCHLOROthiazide (PRINZIDE) 20-12.5 mg per tablet Take 1 tablet by mouth daily. 11/08/23  Yes Leotis Sella, NP  metFORMIN (GLUCOPHAGE) 1,000 mg tablet Take 1 tablet (1,000 mg total) by  mouth in the morning and 1 tablet (1,000 mg total) in the evening. Take with meals. 12/27/23  Yes Kristen Diane Kaplan, PA-C  metoprolol  tartrate (LOPRESSOR ) 100 mg tablet  Take 1 tablet by mouth once daily Patient taking differently: Take 100 mg by mouth daily. 07/08/24  Yes Leotis Sella, NP  rosuvastatin  (CRESTOR ) 20 mg tablet Take 1 tablet (20 mg total) by mouth daily. 11/08/23  Yes Leotis Sella, NP  warfarin (COUMADIN ) 5 mg tablet Patient states that he takes 5mg s MWF and 7.5 mgs all other days. 06/27/24  Yes Kristen Diane Kaplan, PA-C  warfarin (COUMADIN ) 1 mg tablet Patient states that he is taking 5 mg MWF and 7.5 mgs all other days. 12/27/23   Kristen Diane Kaplan, PA-C  warfarin (COUMADIN ) 3 mg tablet Patient states that he is taking 5 mgs MWF and 7.5 mgs all other days. 12/27/23   Josette Loa Aho, PA-C    Medical History[2]  Surgical History[3]  Social History: Tobacco Use History[4] Social History   Substance and Sexual Activity  Alcohol Use No   Social History   Substance and Sexual Activity  Drug Use No    Family History[5]  Code Status: No Order  Review of Systems: All pertinent positives are noted in the HPI.  All others are negative Objective  Physical Exam: @VSINC @   PHYSICAL EXAMINATION: GENERAL: The patient is in no acute distress. EYES: Eyes appear normal ENT: Oropharynx is clear.  Ears appear normal.   Dentition is within normal limits. NECK:  There is no JVD, or carotid bruits.  The thyroid is not enlarged. LYMPH: There are no masses or lymphadenopathy present. HEART: Tachycardic irregular rate and rhythm normal S1, mechanical S2 with a grade 2 systolic murmur heard best at the right upper sternal border. LUNGS: Decreased breath sounds in the lung bases.  Scattered wheezes. ABDOMEN:  The abdomen is soft, nontender, and nondistended with normoactive bowel sounds.  There is no hepatosplenomegaly.  The abdominal aorta is normal. EXTREMITIES:  No clubbing, cyanosis, or edema. PULSES:  Pulses are 2+ bilaterally. NEUROLOGIC: The patient was oriented to person, place, and time.  No overt neurologic deficits were  detected. PSYCH:  Normal judgment and insight, mood is appropriate.   Test Results   Labs:  No results for input(s): CKTOTAL, CKMB, CKMBINDEX in the last 72 hours.  Invalid input(s): TROPONINI Results from last 7 days  Lab Units 09/27/24 1104  WHITE BLOOD CELL COUNT 10*3/uL 7.18  RED BLOOD CELL COUNT 10*6/uL 4.84  HEMOGLOBIN g/dL 85.3  HEMATOCRIT % 56.8  MEAN CORPUSCULAR VOLUME fL 89.1  MEAN CORPUSCULAR HEMOGLOBIN pg 30.2  MEAN CORPUSCULAR HEMOGLOBIN CONC g/dL 66.0  RED CELL DISTRIBUTION WIDTH % 14.6  PLATELET COUNT 10*3/uL 205  MEAN PLATELET VOLUME fL 10.2  . Results from last 7 days  Lab Units 09/27/24 1104  SODIUM mmol/L 139  POTASSIUM mmol/L 4.5  CHLORIDE mmol/L 103  BUN mg/dL 21  CREATININE mg/dL 8.85   Lab Results  Component Value Date   HDL 35 (L) 06/27/2024   INR 2.6 (H) 09/27/2024   BNP 385 (H) 09/27/2024       Debby Isla Manner, MD, MD, [facility] 09/27/2024, 7:47 PM       [1] No Known Allergies [2] Past Medical History: Diagnosis Date   Aortic valve replaced    Diabetes mellitus (CMD)    Hypercholesterolemia    Hypertension   [3] Past Surgical History: Procedure Laterality Date   AORTIC VALVE REPLACEMENT  2001  Procedure: AORTIC VALVE REPLACEMENT   APPENDECTOMY     Procedure: APPENDECTOMY   HAND SURGERY     Procedure: HAND SURGERY  [4] Social History Tobacco Use  Smoking Status Former  Smokeless Tobacco Never  [5] Family History Problem Relation Name Age of Onset   Diabetes Mother     Hypertension Mother     Thyroid disease Mother     Emphysema Father     Hypertension Father     Hyperlipidemia Brother     Cancer Sister     Stroke Maternal Grandfather    "

## 2024-09-28 NOTE — ED Provider Notes (Addendum)
 "  [facility] Heart and Vascular Cardiology Progress Note  Principal Problem:   Atrial fibrillation with RVR    (CMD) Active Problems:   Shortness of breath   Atrial fibrillation with rapid ventricular response    (CMD)   Essential hypertension   Long term current use of anticoagulant    LOS: 1 day   Primary Cardiologist: Lynwood Landing, M.D. and Leotis Clover, NP  Portions of this note were dictated using DRAGON voice recognition software. Please disregard any errors in transcription.  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 Hinz heart rate seems markedly improved on exam today.  Unfortunately, he is not currently on a monitor.  The monitor is being placed.  I would like him to have an EKG today to assess his heart rhythm.  On exam, he sounds to be regular with extrasystoles rather than remaining in atrial fibrillation.  At this point, would recommend he continue current medications.  Continue gentle diuresis.  An echocardiogram is pending for today.    Subjective:    He feels much better.  He denies any chest pain or significant shortness of breath.  His swelling is improved.  Physical Examination:   Temp:  [97.3 F (36.3 C)-97.9 F (36.6 C)] 97.9 F (36.6 C) Heart Rate:  [68-159] 95 Resp:  [14-26] 25 BP: (85-153)/(37-105) 109/78 Wt Readings from Last 3 Encounters:  09/27/24 81.3 kg (179 lb 3.2 oz)  06/27/24 84.5 kg (186 lb 3.2 oz)  06/03/24 83.5 kg (184 lb)      General:  Resting comfortably in NAD Neuro:  Alert & oriented X 3.   HEENT: EOMI mucous membranes moist Resp:  Resp even and nonlabored.  Lungs : Clear to auscultation CV: The rhythm seems to be regular with extrasystoles.  There is a normal S1  and mechanical S2 with a grade 2 systolic murmur GI:  Soft, nontender, nondistended, NABS Ext:  No edema Neck:  No JVD Pulses:  2+ and symmetrical upper and lower extremities     Debby Isla Manner, MD, MD, [facility] 09/28/2024, 9:17 AM   Objective Data Reviewed During this Patient Encounter:   Intake/Output:  Intake/Output Summary (Last 24 hours) at 09/28/2024 0917 Last data filed at 09/28/2024 0313 Gross per 24 hour  Intake 100 ml  Output 350 ml  Net -250 ml    Labs:  No results found for: CKTOTAL, CKMB, CKMBINDEX Results from last 7 days  Lab Units 09/27/24 1104  WHITE BLOOD CELL COUNT 10*3/uL 7.18  RED BLOOD CELL COUNT 10*6/uL 4.84  HEMOGLOBIN g/dL 85.3  HEMATOCRIT % 56.8  MEAN CORPUSCULAR VOLUME fL 89.1  MEAN CORPUSCULAR HEMOGLOBIN pg 30.2  MEAN CORPUSCULAR HEMOGLOBIN CONC g/dL 66.0  RED CELL DISTRIBUTION WIDTH % 14.6  PLATELET COUNT 10*3/uL 205  MEAN PLATELET VOLUME fL 10.2  . Results from last 7 days  Lab Units 09/28/24 0541  SODIUM mmol/L 139  POTASSIUM mmol/L 3.9  CHLORIDE mmol/L 102  BUN mg/dL 26*  CREATININE mg/dL 8.52*   Lab Results  Component Value Date   HDL 35 (L) 06/27/2024   INR 2.9 (H) 09/28/2024   BNP 385 (H) 09/27/2024    ezetimibe , 10 mg, oral, At Bedtime furosemide , 20 mg, intravenous, BID [Held by provider] hydroCHLOROthiazide, 12.5 mg, oral, Daily [Held by provider] lisinopriL, 5 mg, oral, Daily  magnesium  sulfate, 2 g, intravenous, Once metoprolol  tartrate, 50 mg, oral, Q12H SCH rosuvastatin , 20 mg, oral, At Bedtime Warfarin Pharmacy to Manage, , miscellaneous, Pharmacy to Manage      "

## 2024-09-29 NOTE — Progress Notes (Signed)
 Case Management Discharge Note        CSN: [id number] DOB: 1941/05/17 Service: General Medicine Location: 767/01  Patient Class: Inpatient  DC Disposition: : Home or Self Care  Discharge DC Disposition: : Home or Self Care  Discharge Referrals Case closed, patient/family agree with disposition plan: Yes       Case Management Coordination Status: Coordination Complete     Joseph Padilla, MSW

## 2024-09-29 NOTE — Progress Notes (Signed)
 "  [facility] Heart and Vascular Cardiology Progress Note  Principal Problem:   Atrial fibrillation with RVR    (CMD) Active Problems:   Shortness of breath   Atrial fibrillation with rapid ventricular response    (CMD)   Essential hypertension   Long term current use of anticoagulant    LOS: 2 days   Primary Cardiologist: Lynwood Landing, M.D. and Leotis Clover, NP  Portions of this note were dictated using DRAGON voice recognition software. Please disregard any errors in transcription.  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  His breathing is markedly improved.  He seems to have converted back to sinus rhythm and is maintaining sinus rhythm over the last 24 hours.  He is having some PACs.  From a cardiology standpoint, he seems to be doing well and is probably safe for discharge home.  After discharge, we will make arrangements for him to wear monitor and then make sure he has follow-up in the office as well.  Unfortunately, his ins and outs have not been well-documented here in the hospital.  If discharged home from the hospital, he will need close follow-up of his renal function and potassium levels.    Subjective:    He feels much better.  He does not describe any significant shortness of breath.  He denies any palpitations.  Physical Examination:   Temp:  [97.3 F (36.3 C)-98.1 F (36.7 C)] 97.5 F (36.4 C) Heart Rate:  [66-97] 72 Resp:  [17-18] 18 BP: (100-123)/(53-85) 119/85 Wt Readings from Last 3 Encounters:  09/29/24 80.3 kg (177 lb 0.5 oz)  06/27/24 84.5 kg (186 lb 3.2 oz)  06/03/24 83.5 kg (184 lb)       General:  Resting comfortably in NAD Neuro:  Alert & oriented X 3.    HEENT: EOMI mucous membranes moist Resp:  Resp even and nonlabored.  Lungs: clear to auscultation CV:  Regular rate and rhythm, Normal S1 and mechanical S2-grade 2 systolic murmur. GI:  Soft, nontender, nondistended, NABS Ext:  No edema Neck:  No JVD Pulses:  2+ and symmetrical upper and lower extremities     Joseph Isla Manner, MD, MD, [facility] 09/29/2024, 8:56 AM   Objective Data Reviewed During this Patient Encounter:   Intake/Output: No intake or output data in the 24 hours ending 09/29/24 0856  Labs:  No results found for: CKTOTAL, CKMB, CKMBINDEX Results from last 7 days  Lab Units 09/29/24 0123  WHITE BLOOD CELL COUNT 10*3/uL 7.34  RED BLOOD CELL COUNT 10*6/uL 4.15*  HEMOGLOBIN g/dL 87.4*  HEMATOCRIT % 62.9*  MEAN CORPUSCULAR VOLUME fL 89.0  MEAN CORPUSCULAR HEMOGLOBIN pg 30.0  MEAN CORPUSCULAR HEMOGLOBIN CONC g/dL 66.2  RED CELL DISTRIBUTION WIDTH % 14.7  PLATELET COUNT 10*3/uL 207  MEAN PLATELET VOLUME fL 10.5*  . Results from last 7 days  Lab Units 09/29/24 0123  SODIUM mmol/L 137  POTASSIUM mmol/L 4.0  CHLORIDE mmol/L 101  BUN mg/dL 39*  CREATININE mg/dL 8.42*   Lab Results  Component Value Date   HDL 35 (L) 06/27/2024   INR 2.9 (H) 09/29/2024   BNP 385 (H) 09/27/2024    ezetimibe , 10 mg, oral, At Bedtime furosemide , 20 mg, intravenous, BID [Held by provider] hydroCHLOROthiazide, 12.5 mg, oral, Daily insulin  aspart (NovoLOG )/insulin  lispro (HumaLOG)  injection, 0-6 Units, subcutaneous, With meals & at bedtime [Held by provider] lisinopriL, 5 mg, oral, Daily metoprolol  tartrate, 50 mg, oral, Q12H SCH rosuvastatin , 20 mg, oral, At Bedtime Warfarin Pharmacy to Manage, , miscellaneous, Pharmacy to Manage      "

## 2024-09-29 NOTE — Discharge Summary (Signed)
 [facility] Hospitalist Discharge Summary  Identifying Information:  Merton Wadlow [birth year] [id number]  Admit date: 09/27/2024  Discharge date: 09/29/2024  Discharge Service: [facility] Hospitalist  Discharge Attending Physician:Eshwar Braxton, MD  Discharge to: Home  Discharge Diagnoses: Active Problems:   Essential hypertension   Long term current use of anticoagulant    Hospital Course:  Huie A. Stegall is an 84 year old male with a history of mechanical aortic valve replacement, atrial fibrillation, hypertension, hyperlipidemia, and type 2 diabetes mellitus who was admitted for evaluation of shortness of breath and was found to have new or recurrent atrial fibrillation with rapid ventricular response (RVR) and possible new-onset congestive heart failure. On admission, he was noted to have stable oxygenation, bilateral lower lung crackles, and small bilateral pleural effusions on chest X-ray, along with an enlarged cardiac silhouette and evidence of cardiomegaly. Laboratory evaluation revealed an elevated BNP and therapeutic INR on chronic warfarin therapy for his mechanical valve.  Cardiology was consulted and confirmed atrial fibrillation with RVR, recommending continued anticoagulation and rate control, as well as diuresis for volume management. He received IV metoprolol  and was transitioned to a diltiazem infusion for rate control, with subsequent improvement in heart rate and blood pressure. Echocardiogram demonstrated a left ventricular ejection fraction of 40-45% and preserved function of the mechanical aortic valve. Over the course of hospitalization, his heart rate stabilized, and he converted back to sinus rhythm with only occasional PACs noted on monitoring. His shortness of breath resolved, and he remained hemodynamically stable without recurrence of palpitations or chest pain.  For volume overload and pleural effusions, he was treated with intravenous furosemide , resulting in  symptomatic improvement and weight loss. His blood pressure was noted to be soft during admission, and lisinopril/hydrochlorothiazide was held. Renal function was monitored closely due to a rise in creatinine and BUN during diuresis, and he was recommended to have close outpatient follow-up of renal function and potassium after discharge.  His diabetes was managed with insulin  during the hospitalization, targeting blood glucose between 140 and 180 mg/dL. Warfarin dosing was managed by pharmacy, with INR remaining therapeutic throughout the admission. No bleeding or thromboembolic complications occurred.  At the time of discharge, he was asymptomatic, in sinus rhythm, and euvolemic, with plans for outpatient cardiac monitoring and follow-up.   Predictive Model Details        15.6% (Medium)  Factor Value   Calculated 09/29/2024 12:04 11% Latest BUN in last 72 hrs 39 mg/dL   Readmission Risk Score v2 Model 9% Braden score 20    7% Number of hospitalizations in last year 0    6% Number of ED visits in last 90 days 1    5% Number of appointments in last 90 days 0     Patient seems to be stable for discharge but he has a high potential for readmission due to multiple medical problems.  Post Discharge Follow Up Issues:  Patient needs to arrange follow-up with PCP in 1 week.  Patient/daughter around bedside will call tomorrow to arrange appointment.  I would like to request the PCP to repeat BMP at the time of follow-up/follow the blood pressure/fluid status and adjust blood pressure medication/diuretics accordingly.  Also I would like to request a PCP to follow sugar level and adjust medications accordingly. Patient also needs to follow with cardiology outpatient.   Procedures: No admission procedures for hospital encounter. _____________________________________________________________________________ Discharge Day Services: BP 119/85 (BP Location: Left arm, Patient Position: Lying)   Pulse 72    Temp 97.5  F (36.4 C) (Oral)   Resp 18   Ht 1.829 m (6')   Wt 80.3 kg (177 lb 0.5 oz)   SpO2 97%   BMI 24.01 kg/m  Pt seen on the day of discharge and determined appropriate for discharge. General physical exam.  Patient is awake, alert and oriented.  Patient looks comfortable.  Condition at Discharge: fair  Length of Discharge: I spent 45 mins in the discharge of this patient. _____________________________________________________________________________ Discharge Medications: Patient Instructions:    Discharge Medications     PAUSE taking these medications      Sig Disp Refill Start End  lisinopriL-hydroCHLOROthiazide 20-12.5 mg per tablet Wait to take this until your doctor or other care provider tells you to start again. Commonly known as: PRINZIDE  Take 1 tablet by mouth daily.  90 tablet  3         New Medications      Sig Disp Refill Start End  furosemide  20 mg tablet Commonly known as: LASIX   Take 0.5 tablets (10 mg total) by mouth in the morning.  15 tablet  0     magnesium  oxide 400 mg (241 mg magnesium ) Tab  Take 1 tablet (400 mg total) by mouth daily.  30 tablet  0         Modified Medications      Sig Disp Refill Start End  metFORMIN 500 mg tablet Commonly known as: GLUCOPHAGE What changed:  medication strength how much to take  Take 1 tablet (500 mg total) by mouth in the morning and 1 tablet (500 mg total) in the evening. Take with meals.  60 tablet  0     metoprolol  tartrate 50 mg tablet Commonly known as: LOPRESSOR  What changed:  medication strength how much to take when to take this  Take 1 tablet (50 mg total) by mouth every 12 (twelve) hours.  180 tablet  0     * warfarin 5 mg tablet Commonly known as: COUMADIN  What changed: Another medication with the same name was changed. Make sure you understand how and when to take each.  Take 7.5 mg by mouth 4 (four) times a week. Patient states that he takes 5mg s MWF and 7.5 mgs all  other days. (Tuesday, Thursday, Saturday and Sunday)   0     * warfarin 3 mg tablet Commonly known as: COUMADIN  What changed: Another medication with the same name was changed. Make sure you understand how and when to take each.  Patient states that he is taking 5 mgs MWF and 7.5 mgs all other days.  90 tablet  4     * warfarin 1 mg tablet Commonly known as: COUMADIN  What changed: Another medication with the same name was changed. Make sure you understand how and when to take each.  Patient states that he is taking 5 mg MWF and 7.5 mgs all other days.  90 tablet  4     * warfarin 5 mg tablet Commonly known as: COUMADIN  What changed:  how much to take how to take this when to take this  Patient states that he takes 5mg s MWF and 7.5 mgs all other days.  90 tablet  4        * * There are duplicate medications prescribed to the patient          Medications To Continue      Sig Disp Refill Start End  ezetimibe  10 mg tablet Commonly known as: ZETIA   Take 1  tablet (10 mg total) by mouth daily Indications: excessive fat in the blood.  90 tablet  3     rosuvastatin  20 mg tablet Commonly known as: CRESTOR   Take 1 tablet (20 mg total) by mouth daily.  90 tablet  3          _____________________________________________________________________________ Pending Test Results (if blank, then none):    Most Recent Labs:  Lab Results  Component Value Date/Time   WBC 7.34 09/29/2024 0123   RBC 4.15 (L) 09/29/2024 0123   HGB 12.5 (L) 09/29/2024 0123   HCT 37.0 (L) 09/29/2024 0123   PLT 207 09/29/2024 0123    Lab Results  Component Value Date   WBC 7.34 09/29/2024   HGB 12.5 (L) 09/29/2024   HCT 37.0 (L) 09/29/2024   PLT 207 09/29/2024    Lab Results  Component Value Date   CO2 29 09/29/2024   BUN 39 (H) 09/29/2024   GLUCOSE 187 (H) 09/29/2024   CREATININE 1.57 (H) 09/29/2024   CALCIUM  8.6 09/29/2024   ALBUMIN 4.1 09/27/2024   AST 34 09/27/2024   ALT  33 09/27/2024    Lab Results  Component Value Date   NA 137 09/29/2024   K 4.0 09/29/2024   CL 101 09/29/2024   CO2 29 09/29/2024   BUN 39 (H) 09/29/2024   CREATININE 1.57 (H) 09/29/2024   CALCIUM  8.6 09/29/2024   MG 1.8 (L) 09/29/2024    Lab Results  Component Value Date   BILITOT 0.9 09/27/2024   PROT 6.8 09/27/2024   ALBUMIN 4.1 09/27/2024   ALT 33 09/27/2024   AST 34 09/27/2024    Lab Results  Component Value Date   INR 2.9 (H) 09/29/2024   Hospital Radiology:  Adult Transthoracic Echo (TTE) Complete Result Date: 09/28/2024                                                   [facility]                                                 [facility]                                                 [facility]  Cardiac                                                 Ultrasound-                                                 [place],                                                [facility]                                                    Bldg                                                  [address]                                                 [place]                                                  [place] [address]                                  Transthoracic Echocardiogram Report Name  MONTAVIOUS, WIERZBA                      Study Date  09-28-2024               Height  72 in MRN  [id number]                                   Patient Location  [facility]         Weight  179 lb DOB  November 08, 1940                                 Gender  Male  BSA  2.0 m2 Age  76 yrs                                     Ethnicity  1                         BP  116-67 mmHg Reason For Study  Dyspnea on exertion  DOE History  CHF Ordering Physician  [facility]            Performed By  LAJUANA CHEW Referring Physician  MCCLORY, ALEXANDRA - - PROCEDURE A two-dimensional transthoracic echocardiogram with color flow and Doppler was performed. Image Quality  Technically adequate. - SUMMARY The left ventricular size is normal. Mild left ventricular hypertrophy Left ventricular systolic function is mildly reduced. LV ejection fraction = 40-45% [similar to prior echo] Left ventricular filling pattern is pseudonormal. There are regional wall motion abnormalities as specified below. There is severe hypokiensis-akinesis of the basal to mid inferior-inferolateral, and anterolateral wall. The right ventricle is normal in size and function. The left atrium is severely dilated. The right atrium is mildly dilated. There is a mechanical aortic valve. The prosthetic aortic valve is well-seated with normal function There is mild to moderate mitral regurgitation. There is mild tricuspid regurgitation. No pulmonary hypertension. The aortic sinus is normal size. IVC size was normal. There is no pericardial effusion. Probably no significant change in comparison with the prior study noted - FINDINGS LEFT VENTRICLE The left ventricular size is normal. Mild left ventricular hypertrophy. Left ventricular systolic function is mildly reduced. LV ejection fraction = 40-45%. Left ventricular filling pattern is pseudonormal. There are regional wall motion abnormalities as specified below. There is severe hypokiensis-akinesis of the basal to mid inferior-inferolateral, and anterolateral wall. - RIGHT VENTRICLE The right ventricle is normal in size and function. LEFT ATRIUM The left atrium is severely dilated. RIGHT ATRIUM The right atrium is mildly dilated. - AORTIC VALVE There is no aortic stenosis. There is no aortic regurgitation. Aortic valve mean pressure gradient is 10 mmHg. There is a mechanical aortic valve. The prosthetic aortic valve is well-seated with normal function. - MITRAL VALVE The mitral valve  leaflets appear thickened, but open well. There is mild to moderate mitral regurgitation. - TRICUSPID VALVE Structurally normal tricuspid valve. There is mild tricuspid regurgitation. Estimated right ventricular systolic pressure is 33 mmHg. No pulmonary hypertension. - PULMONIC VALVE The pulmonic valve is not well visualized. Trace pulmonic valvular regurgitation. There is no pulmonic valvular stenosis. - ARTERIES The aortic sinus is normal size. - VENOUS IVC size was normal. - EFFUSION There is no pericardial effusion. - - MMode-2D Measurements & Calculations IVSd  1.2 cm               LA dim  5.6 cm            ESV MOD-sp4         LVOT diam  2.0 cm LVIDd  5.5 cm              EDV MOD-sp4   200.0 ml    123.0 ml LVPWd  1.0 cm LVIDs  4.6 cm           _________________________________________________________________________________ SV MOD-sp4   77.0 ml       EF A4C  38.5 %            LA ESV  BP  LA ESV Index  A2C  SI MOD-sp4   37.9 ml-m2                              106.0 ml            39.0 ml-m2           _________________________________________________________________________________ LA ESV Index  A4C          LA ESV Index  BP          SV A4C  77.0 ml 59.6 ml-m2                 52.2 ml-m2 Doppler Measurements & Calculations MV E max vel            SV LVOT   43.1 ml             LV V1 VTI  13.3 cm 141.0 cm-sec            Ao V2 max  222.3 cm-sec                              MR ERO  0.22 cm2 MV A max vel            Ao max PG  19.8 mmHg                                 MR PISA radius 94.9 cm-sec             Ao V2 mean  150.2 cm-sec                             0.79 cm MV E-A  1.5             Ao mean PG  10.3 mmHg                                MR alias vel Med Peak E  Vel         Ao V2 VTI  41.2 cm                                   28.0 cm-sec 7.0 cm-sec Lat Peak E  Vel         AVA  VTI   1.0 cm2 6.3 cm-sec E-Lat E`  22.3 E-Med E`  20.3            _________________________________________________________________________________ TR max vel              AS Dimensionless Index  VTI   AVAi VTI  cm^2-m^2     SV index LVOT  259.5 cm-sec            0.32 TR max PG  26.9 mmHg                                  0.51 cm2               21.2 ml-m2 ______________________________________________________________________________ Reading Physician  MD Carlin Hoe, MD, [phone] 09-28-2024 12 06 PM  ECG 12 lead Result Date: 09/28/2024 Ventricular Rate                   94        BPM                 Atrial Rate                        94        BPM                 P-R Interval                       176       ms                  QRS Duration                       166       ms                  Q-T Interval                       454       ms                  QTC Calculation Bazett             567       ms                  Calculated P Axis                  69        degrees             Calculated R Axis                  137       degrees             Calculated T Axis                  8         degrees             Sinus rhythm with Premature atrial complexes Possible Left atrial enlargement 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 When compared with ECG of 27-Sep-2024 18 17, Sinus rhythm has replaced Atrial fibrillation Confirmed by Hoe Carlin  [phone]  on 09-28-2024 10 22 53 AM  ECG 12 lead Result Date: 09/28/2024 Ventricular Rate                   132       BPM                 Atrial Rate                        156       BPM                 QRS Duration                       150       ms  Q-T Interval                       380       ms                  QTC Calculation Bazett             563       ms                  Calculated R Axis                  141       degrees             Calculated T Axis                  2         degrees             Atrial fibrillation with rapid ventricular response Right bundle branch  block When compared with ECG of 27-Sep-2024 09 57, Atrial fibrillation has replaced Sinus rhythm Vent. rate has increased by  54 bpm Confirmed by Rojelio Dunnings  [phone]  on 09-28-2024 8 04 40 AM  XR Chest 1 View Result Date: 09/27/2024 XR CHEST 1 VIEW, 09/27/2024 11:30 AM INDICATION:sob COMPARISON: None FINDINGS: Supportive devices: None Cardiovascular/lungs/pleura: Enlarged cardiac silhouette. Aortic arch calcification. Small right more than left pleural effusions. Airspace opacity in the right lower lung. Other: Sternotomy changes. No acute fracture or suspicious osseous lesion.   *  Small bilateral pleural effusions with adjacent atelectasis versus aspiration in the lower lungs. *  Cardiomegaly.   ECG 12 lead Result Date: 09/27/2024 Ventricular Rate                   78        BPM                 Atrial Rate                        78        BPM                 P-R Interval                       142       ms                  QRS Duration                       156       ms                  Q-T Interval                       476       ms                  QTC Calculation Bazett             542       ms                  Calculated P Axis  8         degrees             Calculated R Axis                  171       degrees             Calculated T Axis                  15        degrees             Sinus rhythm with occasional Premature ventricular complexes 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 When compared with ECG of 15-Jan-1999 07 53, Premature ventricular complexes are now present Right bundle branch block now present Confirmed by Debora Mealing  [phone]  on 09-27-2024 10 50 37 AM   _____________________________________________________________________________ Discharge Instructions:      Future Appointments  Date Time Provider Department Center  12/03/2024  9:30 AM Leotis Sella, NP [facility] [address] [facility]  01/01/2025  8:50 AM Josette Loa Aho,  PA-C [facility] [id number]   03/04/2025  1:00 PM Leotis Sella, NP [facility] [address]         *Some images could not be shown.

## 2024-09-29 NOTE — Progress Notes (Signed)
 Pharmacokinetic Consult - Anticoagulation Dosing  Joseph Padilla is a 84 y.o. male who has been consulted for warfarin  dosing with following  indication of a.fib and valve replacement .  Current Hematologic Labs: Lab Results  Component Value Date/Time   WBC 7.34 09/29/2024 01:23 AM   RBC 4.15 (L) 09/29/2024 01:23 AM   HGB 12.5 (L) 09/29/2024 01:23 AM   HCT 37.0 (L) 09/29/2024 01:23 AM   MCV 89.0 09/29/2024 01:23 AM   MCH 30.0 09/29/2024 01:23 AM   Platelet Count (PLT)  Date Value Ref Range Status  09/29/2024 207 150 - 450 10*3/uL Final   No results found for: PTT International Normalized Ratio (INR)  Date Value Ref Range Status  09/29/2024 2.9 (H) 0.0 - 1.5 Final    Other Objective Data:    Asessment  INR today is 2.9 after 2mg  given yesterday for INR of 2.9.    Plan   Will give 2mg  warfarin today and follow up INR tomorrow.   Will continue to follow patient's clinical progress daily.  Satko Pleasant HERO, [facility]

## 2024-10-06 NOTE — Progress Notes (Signed)
 Please call the patient to report the following: Electrolytes and kidney function are normal. Kidney function has significantly improved since hospitalization, which is great. I reviewed most recent A1c, as blood sugar is a bit elevated on labs. I recommend increasing metformin to 1000 mg twice daily. Please have him schedule non-fasting lab visit in 3 months to recheck A1c. Please inform office asap if he has upset stomach issues with higher dose metformin or has any issues taking higher dose.

## 2024-10-08 ENCOUNTER — Other Ambulatory Visit: Payer: Self-pay

## 2024-10-08 ENCOUNTER — Inpatient Hospital Stay (HOSPITAL_COMMUNITY)
Admission: EM | Admit: 2024-10-08 | Discharge: 2024-10-10 | DRG: 291 | Disposition: A | Discharge status: Home / Self Care | Attending: Family Medicine | Admitting: Family Medicine

## 2024-10-08 ENCOUNTER — Emergency Department (HOSPITAL_COMMUNITY)

## 2024-10-08 DIAGNOSIS — E1165 Type 2 diabetes mellitus with hyperglycemia: Secondary | ICD-10-CM | POA: Diagnosis present

## 2024-10-08 DIAGNOSIS — Z7984 Long term (current) use of oral hypoglycemic drugs: Secondary | ICD-10-CM | POA: Diagnosis not present

## 2024-10-08 DIAGNOSIS — Z789 Other specified health status: Secondary | ICD-10-CM

## 2024-10-08 DIAGNOSIS — I5023 Acute on chronic systolic (congestive) heart failure: Secondary | ICD-10-CM | POA: Diagnosis present

## 2024-10-08 DIAGNOSIS — I480 Paroxysmal atrial fibrillation: Secondary | ICD-10-CM | POA: Diagnosis present

## 2024-10-08 DIAGNOSIS — N179 Acute kidney failure, unspecified: Secondary | ICD-10-CM | POA: Diagnosis present

## 2024-10-08 DIAGNOSIS — R9431 Abnormal electrocardiogram [ECG] [EKG]: Secondary | ICD-10-CM | POA: Insufficient documentation

## 2024-10-08 DIAGNOSIS — R0602 Shortness of breath: Principal | ICD-10-CM

## 2024-10-08 DIAGNOSIS — Z79899 Other long term (current) drug therapy: Secondary | ICD-10-CM | POA: Diagnosis not present

## 2024-10-08 DIAGNOSIS — E785 Hyperlipidemia, unspecified: Secondary | ICD-10-CM | POA: Diagnosis present

## 2024-10-08 DIAGNOSIS — I340 Nonrheumatic mitral (valve) insufficiency: Secondary | ICD-10-CM | POA: Diagnosis present

## 2024-10-08 DIAGNOSIS — Z952 Presence of prosthetic heart valve: Secondary | ICD-10-CM

## 2024-10-08 DIAGNOSIS — Z87891 Personal history of nicotine dependence: Secondary | ICD-10-CM | POA: Diagnosis not present

## 2024-10-08 DIAGNOSIS — I110 Hypertensive heart disease with heart failure: Principal | ICD-10-CM | POA: Diagnosis present

## 2024-10-08 DIAGNOSIS — I5033 Acute on chronic diastolic (congestive) heart failure: Secondary | ICD-10-CM | POA: Diagnosis not present

## 2024-10-08 DIAGNOSIS — I5043 Acute on chronic combined systolic (congestive) and diastolic (congestive) heart failure: Secondary | ICD-10-CM | POA: Diagnosis not present

## 2024-10-08 DIAGNOSIS — I509 Heart failure, unspecified: Secondary | ICD-10-CM | POA: Diagnosis not present

## 2024-10-08 DIAGNOSIS — Z7901 Long term (current) use of anticoagulants: Secondary | ICD-10-CM

## 2024-10-08 DIAGNOSIS — I493 Ventricular premature depolarization: Secondary | ICD-10-CM | POA: Diagnosis present

## 2024-10-08 LAB — CBC WITH DIFFERENTIAL/PLATELET
Abs Immature Granulocytes: 0.02 K/uL (ref 0.00–0.07)
Basophils Absolute: 0 K/uL (ref 0.0–0.1)
Basophils Relative: 1 %
Eosinophils Absolute: 0.1 K/uL (ref 0.0–0.5)
Eosinophils Relative: 1 %
HCT: 46.6 % (ref 39.0–52.0)
Hemoglobin: 15.4 g/dL (ref 13.0–17.0)
Immature Granulocytes: 0 %
Lymphocytes Relative: 11 %
Lymphs Abs: 0.8 K/uL (ref 0.7–4.0)
MCH: 30.6 pg (ref 26.0–34.0)
MCHC: 33 g/dL (ref 30.0–36.0)
MCV: 92.5 fL (ref 80.0–100.0)
Monocytes Absolute: 0.5 K/uL (ref 0.1–1.0)
Monocytes Relative: 7 %
Neutro Abs: 5.7 K/uL (ref 1.7–7.7)
Neutrophils Relative %: 80 %
Platelets: 276 K/uL (ref 150–400)
RBC: 5.04 MIL/uL (ref 4.22–5.81)
RDW: 14.1 % (ref 11.5–15.5)
WBC: 7.1 K/uL (ref 4.0–10.5)
nRBC: 0 % (ref 0.0–0.2)

## 2024-10-08 LAB — CBG MONITORING, ED
Glucose-Capillary: 201 mg/dL — ABNORMAL HIGH (ref 70–99)
Glucose-Capillary: 203 mg/dL — ABNORMAL HIGH (ref 70–99)
Glucose-Capillary: 239 mg/dL — ABNORMAL HIGH (ref 70–99)

## 2024-10-08 LAB — COMPREHENSIVE METABOLIC PANEL WITH GFR
ALT: 52 U/L — ABNORMAL HIGH (ref 0–44)
AST: 34 U/L (ref 15–41)
Albumin: 3.5 g/dL (ref 3.5–5.0)
Alkaline Phosphatase: 74 U/L (ref 38–126)
Anion gap: 13 (ref 5–15)
BUN: 35 mg/dL — ABNORMAL HIGH (ref 8–23)
CO2: 22 mmol/L (ref 22–32)
Calcium: 9.1 mg/dL (ref 8.9–10.3)
Chloride: 101 mmol/L (ref 98–111)
Creatinine, Ser: 1.2 mg/dL (ref 0.61–1.24)
GFR, Estimated: >60 mL/min
Glucose, Bld: 232 mg/dL — ABNORMAL HIGH (ref 70–99)
Potassium: 4.1 mmol/L (ref 3.5–5.1)
Sodium: 136 mmol/L (ref 135–145)
Total Bilirubin: 0.6 mg/dL (ref 0.0–1.2)
Total Protein: 5.7 g/dL — ABNORMAL LOW (ref 6.5–8.1)

## 2024-10-08 LAB — HEMOGLOBIN A1C
Hgb A1c MFr Bld: 8.2 % — ABNORMAL HIGH (ref 4.8–5.6)
Mean Plasma Glucose: 188.64 mg/dL

## 2024-10-08 LAB — PROTIME-INR
INR: 3.4 — ABNORMAL HIGH (ref 0.8–1.2)
Prothrombin Time: 35.9 s — ABNORMAL HIGH (ref 11.4–15.2)

## 2024-10-08 LAB — TROPONIN T, HIGH SENSITIVITY
Troponin T High Sensitivity: 102 ng/L (ref 0–19)
Troponin T High Sensitivity: 106 ng/L (ref 0–19)
Troponin T High Sensitivity: 120 ng/L (ref 0–19)

## 2024-10-08 LAB — RESP PANEL BY RT-PCR (RSV, FLU A&B, COVID)  RVPGX2
Influenza A by PCR: NEGATIVE
Influenza B by PCR: NEGATIVE
Resp Syncytial Virus by PCR: NEGATIVE
SARS Coronavirus 2 by RT PCR: NEGATIVE

## 2024-10-08 LAB — MAGNESIUM: Magnesium: 1.4 mg/dL — ABNORMAL LOW (ref 1.7–2.4)

## 2024-10-08 LAB — PRO BRAIN NATRIURETIC PEPTIDE: Pro Brain Natriuretic Peptide: 6048 pg/mL — ABNORMAL HIGH

## 2024-10-08 MED ORDER — WARFARIN - PHARMACIST DOSING INPATIENT: Freq: Every day | Status: DC

## 2024-10-08 MED ORDER — SENNA 8.6 MG PO TABS
1.0000 | ORAL_TABLET | Freq: Two times a day (BID) | ORAL | Status: DC
  Administered 2024-10-08 – 2024-10-10 (×4): 8.6 mg via ORAL
  Filled 2024-10-08 (×4): qty 1

## 2024-10-08 MED ORDER — INSULIN ASPART 100 UNIT/ML IJ SOLN
0.0000 [IU] | Freq: Three times a day (TID) | INTRAMUSCULAR | Status: DC
  Administered 2024-10-08: 5 [IU] via SUBCUTANEOUS
  Administered 2024-10-09 (×2): 3 [IU] via SUBCUTANEOUS
  Administered 2024-10-09: 5 [IU] via SUBCUTANEOUS
  Filled 2024-10-08: qty 3
  Filled 2024-10-08: qty 5
  Filled 2024-10-08: qty 3
  Filled 2024-10-08: qty 5

## 2024-10-08 MED ORDER — METOPROLOL TARTRATE 5 MG/5ML IV SOLN
2.5000 mg | Freq: Once | INTRAVENOUS | Status: AC
  Administered 2024-10-08: 2.5 mg via INTRAVENOUS
  Filled 2024-10-08: qty 5

## 2024-10-08 MED ORDER — ROSUVASTATIN CALCIUM 20 MG PO TABS
20.0000 mg | ORAL_TABLET | Freq: Every day | ORAL | Status: DC
  Administered 2024-10-09 – 2024-10-10 (×2): 20 mg via ORAL
  Filled 2024-10-08 (×2): qty 1

## 2024-10-08 MED ORDER — WARFARIN SODIUM 5 MG PO TABS
5.0000 mg | ORAL_TABLET | Freq: Once | ORAL | Status: DC
  Filled 2024-10-08: qty 1

## 2024-10-08 MED ORDER — EZETIMIBE 10 MG PO TABS
10.0000 mg | ORAL_TABLET | Freq: Every day | ORAL | Status: DC
  Administered 2024-10-08 – 2024-10-10 (×3): 10 mg via ORAL
  Filled 2024-10-08 (×3): qty 1

## 2024-10-08 MED ORDER — FUROSEMIDE 10 MG/ML IJ SOLN
60.0000 mg | Freq: Once | INTRAMUSCULAR | Status: AC
  Administered 2024-10-08: 60 mg via INTRAVENOUS
  Filled 2024-10-08: qty 6

## 2024-10-08 MED ORDER — MAGNESIUM SULFATE 4 GM/100ML IV SOLN
4.0000 g | Freq: Once | INTRAVENOUS | Status: AC
  Administered 2024-10-08: 4 g via INTRAVENOUS
  Filled 2024-10-08: qty 100

## 2024-10-08 MED ORDER — ACETAMINOPHEN 325 MG PO TABS: 650.0000 mg | ORAL_TABLET | Freq: Four times a day (QID) | ORAL | Status: DC | PRN

## 2024-10-08 MED ORDER — POLYETHYLENE GLYCOL 3350 17 G PO PACK: 17.0000 g | PACK | Freq: Every day | ORAL | Status: DC | PRN

## 2024-10-08 MED ORDER — ACETAMINOPHEN 650 MG RE SUPP: 650.0000 mg | Freq: Four times a day (QID) | RECTAL | Status: DC | PRN

## 2024-10-08 NOTE — ED Provider Notes (Signed)
 " [facility] EMERGENCY DEPARTMENT AT [facility] Provider Note   CSN: [id number] Arrival date & time: 10/08/24  1113     Patient presents with: Shortness of Breath   Joseph Padilla is a 84 y.o. male.   HPI Patient with history of prior valve repair, now on Coumadin , presents with dyspnea via EMS. He notes that he has been short of breath for days, and has been taking his medication regularly, has worsening edema, increasing shortness of breath.  Pt BIBA from home for [facility]. Denies CP/Dizziness. AOx4. Ems states that they were unable to palpate radial artery. ; recently placed on Lasix .  BP - 150/80 ( but medic unsure if its right) Pulse - 105-130s - Afib Spo2 - extremity too cold to monitor.  24 G R hand.     Prior to Admission medications  Medication Sig Start Date End Date Taking? Authorizing Provider  ezetimibe  (ZETIA ) 10 MG tablet Take 10 mg by mouth daily.    [provider]  glimepiride (AMARYL) 4 MG tablet Take 4 mg by mouth daily before breakfast.    [provider]  lisinopril-hydrochlorothiazide (PRINZIDE,ZESTORETIC) 20-12.5 MG per tablet Take 1 tablet by mouth daily.    [provider]  metFORMIN (GLUCOPHAGE) 1000 MG tablet Take 1,000 mg by mouth 2 (two) times daily with a meal.    [provider]  metoprolol  (LOPRESSOR ) 100 MG tablet Take 100 mg by mouth daily.    [provider]  rosuvastatin  (CRESTOR ) 20 MG tablet Take 20 mg by mouth daily.    [provider]  warfarin (COUMADIN ) 7.5 MG tablet Take 1 tablet (7.5 mg total) by mouth at bedtime. 03/24/21   Ghimire, Donalda HERO, MD    Allergies: Patient has no known allergies.    Review of Systems  Updated Vital Signs BP (!) 95/59 (BP Location: Left Arm)   Pulse (!) 102   Resp (!) 23   SpO2 100%   Physical Exam Vitals and nursing note reviewed.  Constitutional:      General: He is not in acute distress.    Appearance: He is well-developed.  HENT:      Head: Normocephalic and atraumatic.  Eyes:     Conjunctiva/sclera: Conjunctivae normal.  Cardiovascular:     Rate and Rhythm: Tachycardia present. Rhythm irregular.  Pulmonary:     Effort: Tachypnea and accessory muscle usage present.     Breath sounds: Decreased breath sounds present.  Abdominal:     General: There is no distension.  Musculoskeletal:     Right lower leg: Edema present.     Left lower leg: Edema present.  Skin:    General: Skin is warm and dry.  Neurological:     Mental Status: He is alert and oriented to person, place, and time.     (all labs ordered are listed, but only abnormal results are displayed) Labs Reviewed  PROTIME-INR - Abnormal; Notable for the following components:      Result Value   Prothrombin Time 35.9 (*)    INR 3.4 (*)    All other components within normal limits  RESP PANEL BY RT-PCR (RSV, FLU A&B, COVID)  RVPGX2  CBC WITH DIFFERENTIAL/PLATELET  COMPREHENSIVE METABOLIC PANEL WITH GFR  PRO BRAIN NATRIURETIC PEPTIDE    EKG: EKG Interpretation Date/Time:  Tuesday October 08 2024 11:35:44 EST Ventricular Rate:  107 PR Interval:  162 QRS Duration:  170 QT Interval:  399 QTC Calculation: 533 R Axis:   162  Text Interpretation: Sinus tachycardia Multiple premature complexes, vent & supraven RBBB and LPFB Confirmed by Garrick Charleston [phone]) on 10/08/2024 12:15:56 PM  Radiology: [facility] Chest Port 1 View Result Date: 10/08/2024 CLINICAL DATA:  Shortness of breath 2 weeks. EXAM: PORTABLE CHEST 1 VIEW COMPARISON:  03/21/2021 FINDINGS: Sternotomy wires unchanged. Lungs are somewhat hypoinflated demonstrate a small right effusion as well as minimal bibasilar opacification which may be due to atelectasis or infection. Calcified granuloma over the left midlung unchanged. Cardiomediastinal silhouette and remainder of the exam is unchanged. IMPRESSION: Small right effusion with minimal bibasilar opacification which may be due to atelectasis or  infection. Electronically Signed   By: Toribio Agreste M.D.   On: 10/08/2024 12:09     Procedures   Medications Ordered in the ED - No data to display                                  Medical Decision Making Elderly male with known heart failure, prior valve repair, known A-fib presents with shortness of breath.  Broad differential including worsening heart failure, uncontrolled A-fib, infection, bacteremia, sepsis. Cardiac 105 A-fib abnormal pulse ox initially on 2 L 99%, patient eventually on room air 99% normal  Amount and/or Complexity of Data Reviewed Independent Historian: EMS External Data Reviewed: notes.    Details: Patient has his outside hospital cardiology notes at bedside Labs: ordered. Decision-making details documented in ED Course. Radiology: ordered and independent interpretation performed. Decision-making details documented in ED Course. ECG/medicine tests: ordered and independent interpretation performed. Decision-making details documented in ED Course.  Risk Prescription drug management. Decision regarding hospitalization. Diagnosis or treatment significantly limited by social determinants of health.   3:03 PM Patient with persistent tachypnea, tachycardia.  He has known A-fib, evidence for this on monitor continues, rate variable, 105, 115, A-fib patient was titrated off of oxygen, but with persistent tachypnea, tachycardia, evidence for decompensated heart failure, patient has received IV Lasix , will require admission for further monitoring, management.  Patient therapeutic with Coumadin  value.     Final diagnoses:  SOB (shortness of breath)     Garrick Charleston, MD 10/08/24 1507  "

## 2024-10-08 NOTE — Progress Notes (Addendum)
 PHARMACY - ANTICOAGULATION CONSULT NOTE  Pharmacy Consult for warfarin Indication: atrial fibrillation/mechanical aortic valve  Allergies[1]  Patient Measurements:    Vital Signs: Temp: 97.8 F (36.6 C) (01/13 1544) Temp Source: Oral (01/13 1544) BP: 112/85 (01/13 1544) Pulse Rate: 55 (01/13 1544)  Labs: Recent Labs    10/08/24 1218 10/08/24 1346  HGB 15.4  --   HCT 46.6  --   PLT 276  --   LABPROT 35.9*  --   INR 3.4*  --   CREATININE  --  1.20    CrCl cannot be calculated (Unknown ideal weight.).   Medical History: Past Medical History:  Diagnosis Date   Diabetes mellitus type 2 in obese (HCC)    History of mechanical aortic valve replacement    valve    Hypertension     Medications:  (Not in a hospital admission)   Assessment: 84 yo M presenting with SOB. On warfarin PTA for afib and mechanical aortic valve replacement (2016). PTA regimen: 5mg  PO MWF, 7.5mg  PO T/Thu/Sa/Su. Pharmacy consulted to dose warfarin.  INR today 3.4, given he is on the higher end of therapeutic will give lower dose than home regimen and recheck tomorrow.  Goal of Therapy:  INR 2.5-3.5 Monitor platelets by anticoagulation protocol: Yes   Plan:  Warfarin 5mg  PO x1 at 1600  Namir Neto, PharmD PGY1 Clinical Pharmacist [facility]  10/08/2024 5:13 PM      [1] No Known Allergies

## 2024-10-08 NOTE — Assessment & Plan Note (Addendum)
 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 

## 2024-10-08 NOTE — ED Triage Notes (Signed)
 Pt BIBA from home for [facility]. Denies CP/Dizziness. AOx4. Ems states that they were unable to palpate radial artery. ; recently placed on Lasix .  BP - 150/80 ( but medic unsure if its right) Pulse - 105-130s - Afib Spo2 - extremity too cold to monitor.  24 G R hand.

## 2024-10-08 NOTE — ED Notes (Signed)
Attempted PIV x 2 unsuccessful 

## 2024-10-08 NOTE — Hospital Course (Signed)
 Joseph Padilla is a 84 y.o.male with a history of  CHF, HTN, Dyslipidemia, and DMII w/ s/p mAVR who was admitted to the [facility] Teaching Service at [facility] for CHF exacerbation. His hospital course is detailed below:  CHF exacerbation Pt was recently admitted at [facility] on 09/27/24 - 09/29/24 with new onset of CHF. TTE at [facility] on 09/28/24 showed EFLV of 40-45%, RA severely dilated, no evidence of pulmonary HTN, w/ mild-mod MR. On admission to [facility] his BNP was 6048, CXR showed small effusion. He was diuresed with IV Lasix  then transitioned to PO; he continued to diurese well. Cardiology consulted during admission and assisted with medication management. He was started on Losartan  25 mg daily w/ plan on eventually transition to Entresto, however ultimately held the Losartan  d/t AKI. He was discharged in stable condition.  A-fib w/ RVR Pt w/ Afib w/ RVR on admission. Hx of A-fib w/ RVR at [facility] (newly diagnosed), had not been taking home Metoprolol . He received IV Metoprolol  with improvement of RVR then transitioned back to home dose Metoprolol  tartate 50 mg BID and continued home dose coumadin .  AKI Creatinine did increase after diuresis with IV lasix ; as such losartan  was held. He did continue to receive lower dose or oral lasix  and remained stable.   Other chronic conditions were medically managed with home medications and formulary alternatives as necessary  PCP Follow-up Recommendations: F/u with his HR. Pt w/ hx of A-fib w/ RVR; while he remained in a-fib during this admission his rate has been controlled with home dose Metoprolol . Follow up with Cardiologist for ischemia evaluation in outpatient setting. F/u BMP in 1 week - if creatinine improving recommend restarting losartan . F/u PT/INR for Coumadin  use. Monitor weight and consider lasix  titration as needed.

## 2024-10-08 NOTE — H&P (Addendum)
 "    Hospital Admission History and Physical Service Pager: [phone]  Patient name: Joseph Padilla Medical record number: [id number] Date of Birth: 1940-10-06 Age: 84 y.o. Gender: male  Primary Care Provider: Debrah Josette ORN., PA-C  Consultants: Cardiology  Code Status: Full Code  Preferred Emergency Contact:  .Debrah Josette ORN., PA-C   Chief Complaint: Worsening SOB   Differential and Medical Decision Making:  Joseph Padilla is a 84 y.o. male presenting with worsening SOB  Differential for this patient's presentation of this includes   CHF exacerbation is most likely. He recently admitted at [facility] on 09/27/24 - 09/29/24 with new onset of CHF TTE at [facility] on 09/28/24 shows EFLV of 40-45% RA severely dilated ,negative Pulmonary HTN, w/ mild-mod MR. During the admission he was recommended to hold his lisinopriL-hydroCHLOROthiazide 20-12.5 mg per tablet daily which he has been starting back and continue Lasix  10 mg daily w/ Metoprolol  50 mg Q12H with other medications. However pt states has not been taking his Metoprolol . Per patient he went home after discharged and become worsening SOB especially w/ Exacerbation in the last few days BNP in ED was 6048. IV Lasix  60 mg once was given. Cardiology was consult.  A-fib w/ RVR Patient c/o palpitation and SOB w/ exacerbation. Beside new onset of CHF he also w/ new A-fib during his admission at [facility] required Metoprolol  and Dilt gtt. He was d/c w/ meto 50 mg BID which pt states has not been taking per pharmacy tech.   Pneumonia  Less likely. Pt denies fever, cough. However CXR show , Cxr show Small right effusion with minimal bibasilar opacification which may be due to atelectasis or infection.We will continue to monitor his s/s of pneumonia  Assessment & Plan CHF exacerbation (HCC) Received IV Lasix  60 mg once in ED BNP 6048 BUN 32 sCr 1.2 Normal GFR. - Admit to FMTS, attending Dr. Anders - Vital signs per floor - Pending Trp, HIV, Lipd  Panel, B12, RPR - Daily weight - Strict I&O  - Cardiology consult, appreciate recommendation  - Continue home Ezetimibe  10 mg at bedtime and Rovastatin 20 mg at bedtime  - Pharmacist consulted for Coumadin  management - Pain control: Tylenol  PRN - Bowel Reg: MiraLax , Senakot  - AM Labs: CBC, BMP, Mg - Fall precautions - Delirium precautions - PT/OT consult  Chronic health problem DMII - A1c 8.2 BG ACHS , continue SSI HTN - Hold home Lisinopril 5 mg daily and hydroCHLOROthiazide 12.5 mg Daily from last admission at [facility]  CVA - Continue to monitor  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 male w/ PMH of CHF, HTN, Dyslipidemia, and DMII w/ s/p mAVR who presenting with worsening SOB with exacerbation. Pt recently admitted at [facility] on 09/27/24 - 09/29/24 with new onset of CHF TTE at [facility] on 09/28/24 shows EFLV of 40-45% RA severely dilated ,negative Pul HTN, w/ mild-mod MR. During the admission he was recommended to hold his lisinopriL-hydroCHLOROthiazide 20-12.5 mg per tablet daily which he has been starting back and continue Lasix  10 mg daily w/ Metoprolol  50 mg Q12H with other medications. However pt states has not been taking his Meto. Pt states he went home after discharge and in the last 3 days he develop SOB especially w/ exertion, heart racing, and chest tightness. He denies fever, abdominal pain, dizziness, N/V/D.  In the ED, he  is hemodynamically stable. His pertinent labs include A1C 8.2, ALT 52(H), BNP 6048, PT 35.9, INR 3.4. CXR shows small right effusion.  Review Of Systems: Per HPI   Remainder reviewed in history tab.   Pertinent Past Surgical History: mAVR   Remainder reviewed in history tab.   Pertinent Social History: Tobacco use: Former Alcohol use: Denies Other Substance use: Denies Lives with self and dog  Pertinent Family  History: None contributing.    Objective: BP 117/71   Pulse (!) 32   Resp (!) 24   SpO2 96%  Physical Exam Neck:     Vascular: JVD present.  Cardiovascular:     Rate and Rhythm: Tachycardia present. Rhythm irregular.  Pulmonary:     Breath sounds: Examination of the right-lower field reveals decreased breath sounds. Examination of the left-lower field reveals decreased breath sounds. Decreased breath sounds present.  Abdominal:     Palpations: Abdomen is soft.  Neurological:     Mental Status: He is alert and oriented to person, place, and time.  Psychiatric:        Mood and Affect: Mood normal.        Behavior: Behavior normal.      Labs:  CBC BMET  Recent Labs  Lab 10/08/24 1218  WBC 7.1  HGB 15.4  HCT 46.6  PLT 276   Recent Labs  Lab 10/08/24 1346  NA 136  K 4.1  CL 101  CO2 22  BUN 35*  CREATININE 1.20  GLUCOSE 232*  CALCIUM  9.1     BNP 6048  EKG: A-fib w/ RVR in 120s  Imaging Studies Performed: PORTABLE CHEST 1 VIEW IMPRESSION: Small right effusion with minimal bibasilar opacification which may be due to atelectasis or infection.  Suzen Houston NOVAK, DO 10/08/2024, 3:19 PM PGY-1, [facility]  FPTS Intern pager: [phone], text pages welcome Secure chat group [facility] Teaching Service    Patient seen along with Resident. I personally evaluated this patient  and verified all aspects of the history, physical exam, and medical decision making as documented. I agree with the documentation and have made all necessary edits.  Lauraine Norse, DO  Family Medicine, PGY-2 10/08/2024 6:29 PM    "

## 2024-10-08 NOTE — Assessment & Plan Note (Addendum)
 Received IV Lasix  60 mg once in ED BNP 6048 BUN 32 sCr 1.2 Normal GFR. - Admit to FMTS, attending Dr. Anders - Vital signs per floor - Pending Trp, HIV, Lipd Panel, B12, RPR - Daily weight - Strict I&O  - Cardiology consult, appreciate recommendation  - Continue home Ezetimibe  10 mg at bedtime and Rovastatin 20 mg at bedtime  - Pharmacist consulted for Coumadin  management - Pain control: Tylenol  PRN - Bowel Reg: MiraLax , Senakot  - AM Labs: CBC, BMP, Mg - Fall precautions - Delirium precautions - PT/OT consult

## 2024-10-08 NOTE — Plan of Care (Addendum)
 FMTS Brief Progress Note  S: Patient was seen and examined at bedside by myself and Dr. Stoney. He had just finished his dinner and is comfortably sitting on the side of the hospital bed. He says his breathing is much better than on admission and tells me about some mild edema to the LLE but denies any CP, SOB, palpitations, or any other complaints at this time.   O: BP (!) 141/113 (BP Location: Left Arm)   Pulse (!) 174   Temp (!) 97.3 F (36.3 C) (Oral)   Resp 16   SpO2 97%   General: A&O, NAD HEENT: No sign of trauma, EOM grossly intact Cardiac: tachycardic at 160bpm, no m/r/g Respiratory: CTAB, normal WOB on RA, no w/c/r GI: non-distended  Extremities: NTTP, trace pitting edema to LLE Neuro: Normal gait, moves all four extremities appropriately. Psych: Appropriate mood and affect   A/P: Plan as per day team note including the following: - IV metoprolol  2.5mg  for elevated HR over 120bpm, currently asymptomatic, will redose if needed - If redose is needed or patient becomes symptomatic, will call cardiology for further recommendations - Mg 1.4, repleted with 4g IV mg - Orders reviewed. Labs for AM ordered, which was adjusted as needed.  - If condition changes, plan includes contact FMTS.   Lupie Credit, DO 10/08/2024, 8:51 PM PGY-1, [facility] Night Resident  Please page [phone] with questions.

## 2024-10-09 ENCOUNTER — Telehealth (HOSPITAL_COMMUNITY): Payer: Self-pay

## 2024-10-09 ENCOUNTER — Other Ambulatory Visit (HOSPITAL_COMMUNITY): Payer: Self-pay

## 2024-10-09 ENCOUNTER — Encounter (HOSPITAL_COMMUNITY): Payer: Self-pay

## 2024-10-09 DIAGNOSIS — N179 Acute kidney failure, unspecified: Secondary | ICD-10-CM | POA: Insufficient documentation

## 2024-10-09 DIAGNOSIS — I5033 Acute on chronic diastolic (congestive) heart failure: Secondary | ICD-10-CM

## 2024-10-09 DIAGNOSIS — Z952 Presence of prosthetic heart valve: Secondary | ICD-10-CM

## 2024-10-09 DIAGNOSIS — R0602 Shortness of breath: Principal | ICD-10-CM

## 2024-10-09 DIAGNOSIS — R9431 Abnormal electrocardiogram [ECG] [EKG]: Secondary | ICD-10-CM | POA: Insufficient documentation

## 2024-10-09 DIAGNOSIS — I480 Paroxysmal atrial fibrillation: Secondary | ICD-10-CM

## 2024-10-09 DIAGNOSIS — I509 Heart failure, unspecified: Secondary | ICD-10-CM | POA: Diagnosis not present

## 2024-10-09 LAB — LIPID PANEL
Cholesterol: 92 mg/dL (ref 0–200)
HDL: 30 mg/dL — ABNORMAL LOW
LDL Cholesterol: 45 mg/dL (ref 0–99)
Total CHOL/HDL Ratio: 3.1 ratio
Triglycerides: 85 mg/dL
VLDL: 17 mg/dL (ref 0–40)

## 2024-10-09 LAB — VITAMIN B12: Vitamin B-12: 243 pg/mL (ref 180–914)

## 2024-10-09 LAB — TROPONIN T, HIGH SENSITIVITY: Troponin T High Sensitivity: 108 ng/L (ref 0–19)

## 2024-10-09 LAB — PROTIME-INR
INR: 3.5 — ABNORMAL HIGH (ref 0.8–1.2)
Prothrombin Time: 36.7 s — ABNORMAL HIGH (ref 11.4–15.2)

## 2024-10-09 LAB — COMPREHENSIVE METABOLIC PANEL WITH GFR
ALT: 55 U/L — ABNORMAL HIGH (ref 0–44)
AST: 39 U/L (ref 15–41)
Albumin: 3.7 g/dL (ref 3.5–5.0)
Alkaline Phosphatase: 81 U/L (ref 38–126)
Anion gap: 15 (ref 5–15)
BUN: 36 mg/dL — ABNORMAL HIGH (ref 8–23)
CO2: 23 mmol/L (ref 22–32)
Calcium: 9.2 mg/dL (ref 8.9–10.3)
Chloride: 100 mmol/L (ref 98–111)
Creatinine, Ser: 1.42 mg/dL — ABNORMAL HIGH (ref 0.61–1.24)
GFR, Estimated: 50 mL/min — ABNORMAL LOW
Glucose, Bld: 215 mg/dL — ABNORMAL HIGH (ref 70–99)
Potassium: 4.3 mmol/L (ref 3.5–5.1)
Sodium: 138 mmol/L (ref 135–145)
Total Bilirubin: 0.6 mg/dL (ref 0.0–1.2)
Total Protein: 6 g/dL — ABNORMAL LOW (ref 6.5–8.1)

## 2024-10-09 LAB — CBC
HCT: 40.8 % (ref 39.0–52.0)
Hemoglobin: 13.5 g/dL (ref 13.0–17.0)
MCH: 30.5 pg (ref 26.0–34.0)
MCHC: 33.1 g/dL (ref 30.0–36.0)
MCV: 92.1 fL (ref 80.0–100.0)
Platelets: 268 K/uL (ref 150–400)
RBC: 4.43 MIL/uL (ref 4.22–5.81)
RDW: 14.1 % (ref 11.5–15.5)
WBC: 8.7 K/uL (ref 4.0–10.5)
nRBC: 0 % (ref 0.0–0.2)

## 2024-10-09 LAB — MAGNESIUM: Magnesium: 2.3 mg/dL (ref 1.7–2.4)

## 2024-10-09 LAB — CBG MONITORING, ED
Glucose-Capillary: 193 mg/dL — ABNORMAL HIGH (ref 70–99)
Glucose-Capillary: 239 mg/dL — ABNORMAL HIGH (ref 70–99)

## 2024-10-09 LAB — GLUCOSE, CAPILLARY
Glucose-Capillary: 162 mg/dL — ABNORMAL HIGH (ref 70–99)
Glucose-Capillary: 189 mg/dL — ABNORMAL HIGH (ref 70–99)

## 2024-10-09 LAB — HIV ANTIBODY (ROUTINE TESTING W REFLEX): HIV Screen 4th Generation wRfx: NONREACTIVE

## 2024-10-09 LAB — APTT: aPTT: 43 s — ABNORMAL HIGH (ref 24–36)

## 2024-10-09 LAB — SYPHILIS: RPR W/REFLEX TO RPR TITER AND TREPONEMAL ANTIBODIES, TRADITIONAL SCREENING AND DIAGNOSIS ALGORITHM: RPR Ser Ql: NONREACTIVE

## 2024-10-09 MED ORDER — FUROSEMIDE 10 MG/ML IJ SOLN: 40.0000 mg | Freq: Once | INTRAMUSCULAR | Status: DC

## 2024-10-09 MED ORDER — METOPROLOL TARTRATE 50 MG PO TABS
50.0000 mg | ORAL_TABLET | Freq: Two times a day (BID) | ORAL | Status: DC
  Administered 2024-10-09 – 2024-10-10 (×3): 50 mg via ORAL
  Filled 2024-10-09: qty 1
  Filled 2024-10-09: qty 2
  Filled 2024-10-09: qty 1

## 2024-10-09 MED ORDER — POLYETHYLENE GLYCOL 3350 17 G PO PACK
17.0000 g | PACK | Freq: Every day | ORAL | Status: DC
  Administered 2024-10-09: 17 g via ORAL
  Filled 2024-10-09 (×2): qty 1

## 2024-10-09 MED ORDER — LOSARTAN POTASSIUM 25 MG PO TABS
25.0000 mg | ORAL_TABLET | Freq: Every day | ORAL | Status: DC
  Administered 2024-10-09: 25 mg via ORAL
  Filled 2024-10-09: qty 1

## 2024-10-09 MED ORDER — WARFARIN SODIUM 7.5 MG PO TABS
7.5000 mg | ORAL_TABLET | Freq: Once | ORAL | Status: AC
  Administered 2024-10-09: 7.5 mg via ORAL
  Filled 2024-10-09: qty 1

## 2024-10-09 NOTE — Progress Notes (Addendum)
 "    Daily Progress Note Intern Pager: [phone]  Patient name: Joseph Padilla Medical record number: [id number] Date of birth: 02/20/1941 Age: 84 y.o. Gender: male  Primary Care Provider: Debrah Josette ORN., PA-C Consultants: Cardiology  Code Status: Full Code  Pt Overview and Major Events to Date:  1/13 : Admitted   Mr. Ferron is a 84 YO male with PMH of HTN, HLD, DMII, recently dx CHF w/ EF 40-45% and s/p mTAVR. He has been admitted for CHF exacerbation.  Assessment & Plan CHF exacerbation (HCC) Echo on  09/28/24 at [facility] shows LVEF of 40-45% w/ Serv dilated RV.  Received 60 IV Lasix  once on ED since admission HIV, RPR Neg B12, Lipid panel WNL  ALT has been stable around 50s I/O Neg 1075 ml last 24 hrs. - Cardiology consult, appreciate recommendations - Starting back on Home metoprolol  50 mg BID  - Vital signs per floor - Daily weight - Strict I&O  - Continue home Ezetimibe  10 mg and Rovastatin 20 mg at bedtime  - Pharmacist consulted for Coumadin  management INR 3.5 w/ PTT 36.7 this AM - Pain control: Tylenol  PRN - Bowel Reg: MiraLax , Senakot  - AM Labs: CBC, BMP, Mg - Fall precautions - Delirium precautions - PT/OT consult w/ pending recommendation  AKI (acute kidney injury) Admission BUN 34 w/ sCr 1.2 (N) and GFR >60 (N) BUN 36 w/ sCr 1.42 and GFR 50(L) this morning Received 60 IV Lasix  once in ED since admission - AM BMP, Mg - Strict I&O - Daily weight  Chronic health problem DMII - A1c 8.2 BG ACHS , continue SSI. Consider starting Jadience 25 mg daily  HTN - Stable BP will continue to Hold home Lisinopril 5 mg daily and hydroCHLOROthiazide 12.5 mg Daily from last admission at [facility]  CVA - Continue to monitor  S/p mTAVR (2001) on Coumadin    FEN/GI: Heart healthy PPx: Warfarin  Dispo: Pending clinical improvement and PT/OT rec  Subjective:  Doing better this morning. A-fib w/ RVR OVN gave 2.5 mg IV Metoprolol  w/ some improvement. No BM in last few days.  Denies SOB or CP   Objective: Temp:  [97.3 F (36.3 C)-97.9 F (36.6 C)] 97.9 F (36.6 C) (01/14 0555) Pulse Rate:  [32-174] 154 (01/14 0800) Resp:  [16-30] 21 (01/14 0800) BP: (95-142)/(52-117) 131/96 (01/14 0800) SpO2:  [91 %-100 %] 92 % (01/14 0800)  Physical Exam Constitutional:      Appearance: He is well-developed.  Cardiovascular:     Rate and Rhythm: Tachycardia present. Rhythm irregular.  Pulmonary:     Effort: Pulmonary effort is normal.     Breath sounds: Rhonchi present.  Abdominal:     Palpations: Abdomen is soft.  Skin:    Capillary Refill: Capillary refill takes less than 2 seconds.  Neurological:     Mental Status: He is alert and oriented to person, place, and time.  Psychiatric:        Mood and Affect: Mood normal.        Behavior: Behavior normal.      Laboratory: Most recent CBC Lab Results  Component Value Date   WBC 8.7 10/09/2024   HGB 13.5 10/09/2024   HCT 40.8 10/09/2024   MCV 92.1 10/09/2024   PLT 268 10/09/2024   Most recent BMP    Latest Ref Rng & Units 10/09/2024    3:19 AM  BMP  Glucose 70 - 99 mg/dL 784   BUN 8 - 23 mg/dL 36  Creatinine 0.61 - 1.24 mg/dL 8.57   Sodium 864 - 854 mmol/L 138   Potassium 3.5 - 5.1 mmol/L 4.3   Chloride 98 - 111 mmol/L 100   CO2 22 - 32 mmol/L 23   Calcium  8.9 - 10.3 mg/dL 9.2     Imaging/Diagnostic Tests: EXAM: PORTABLE CHEST 1 VIEW  IMPRESSION: Small right effusion with minimal bibasilar opacification which may be due to atelectasis or infection.  Suzen Houston NOVAK, DO 10/09/2024, 8:53 AM  PGY-1, [facility] FPTS Intern pager: [phone], text pages welcome Secure chat group [facility] Teaching Service   "

## 2024-10-09 NOTE — Consult Note (Addendum)
 "  Cardiology Consultation   Patient ID: Joseph Padilla MRN: [id number]; DOB: 08-08-1941  Admit date: 10/08/2024 Date of Consult: 10/09/2024  PCP:  Debrah Josette ORN., PA-C   [facility] Providers Cardiologist:  [facility]    Patient Profile: Joseph Padilla is a 84 y.o. 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 Anders.  History of Present Illness: Joseph Padilla is an 84 year old male with above medical history who is followed by [facility]. Patient previously had mechanical AVR in 2001. Has been on coumadin .   Echocardiogram in 08/2021 showed EF 50-55%, mild-moderate MR, mechanical aortic valve with trace AI. Echo stable in 08/2022.   Later, echo in 04/2024 showed EF 50-55% with new wall motion abnormalities, moderate diastolic dysfunction, severe LA dilation, mild-moderate MR, mechanical aortic valve with mean gradient 18 mmHg and mild AI. He was seen by his cardiologist as an outpatient in 05/2024. He was offered a stress test due to wall motion abnormalities on echo, but he declined as he was not having any cardiac symptoms.   [facility] to [facility] in [place] from 1/2-09/29/14. He had presented with shortness of breath and was found to be in new onset atrial fibrillation with RVR. Treated with diuresis. Remained on metoprolol . He converted back to NSR on metoprolol  and he remained on coumadin  for [facility]. Echocardiogram during that admission on 09/28/24 showed EF 40-45% with regional wall motion abnormalities, severe LA dilation, mild-moderate MR. Mechanical aortic valve was well-seated with normal function. Discharged on lasix  10 mg daily, metoprolol  tartrate 50 mg BID, coumadin , crestor , and zetia    Presented to the ED at [facility] on 10/08/24 complaining of shortness of breath. Initial EKG showed MAT with HR 107 BPM, PVC  present. COVID, Flu, RSV negative. Pro BNP 6048. hsTn 102>106>120>108. CXR showed small right effusion with minimal bibasilar opacification which may be due to atelectasis or infection   Patient admitted to family medicine teaching service. Given a dose of IV lasix  60 mg.   Today, patient tells me that he has been having issues with shortness of breath for the past 3 weeks or so.  He was seen at [facility] for this from 1/2 - 1/4.  He felt well after being discharged.  On Tuesday 1/13, patient was walking out to his garbage can when he noticed he was getting very short of breath.  He fell like he could not walk all the way back to his house.  He decided to come to the hospital because of his shortness of breath.  Patient denies having any chest pain.  Denies orthopnea.  Had a little swelling in his left leg but otherwise denies swelling.  He denies having weight gain.  Estimates that his dry weight is between 184-187 pounds.  He currently feels much better.  Tells me that his breathing is back to normal after he received Lasix .  He occasionally has episodes of palpitations, usually when he is short of breath.  Tells me that these are mild and do not bother him very much.  Past Medical History:  Diagnosis Date   Diabetes mellitus type 2 in obese [facility])    History of mechanical aortic valve replacement    valve    Hypertension     Past Surgical History:  Procedure Laterality Date   APPENDECTOMY  BACK SURGERY     VALVE REPLACEMENT        Scheduled Meds:  ezetimibe   10 mg Oral Daily   insulin  aspart  0-15 Units Subcutaneous TID WC   losartan   25 mg Oral Daily   metoprolol  tartrate  50 mg Oral BID   polyethylene glycol  17 g Oral Daily   rosuvastatin   20 mg Oral Daily   senna  1 tablet Oral BID   warfarin  5 mg Oral ONCE-1600   Warfarin - Pharmacist Dosing Inpatient   Does not apply q1600   Continuous Infusions:  PRN Meds: acetaminophen  **OR** acetaminophen   Allergies:    Allergies[1]  Social History:   Social History   Socioeconomic History   Marital status: Married    Spouse name: Not on file   Number of children: Not on file   Years of education: Not on file   Highest education level: Not on file  Occupational History   Not on file  Tobacco Use   Smoking status: Never   Smokeless tobacco: Never  Vaping Use   Vaping status: Never Used  Substance and Sexual Activity   Alcohol use: Never   Drug use: Never   Sexual activity: Not on file  Other Topics Concern   Not on file  Social History Narrative   Not on file   Social Drivers of Health   Tobacco Use: Medium Risk (10/04/2024)   Received from [facility]   Patient History    Smoking Tobacco Use: Former    Smokeless Tobacco Use: Never    Passive Exposure: Not on file  Financial Resource Strain: Low Risk (09/28/2024)   Received from [facility]   Overall Financial Resource Strain (CARDIA)    How hard is it for you to pay for the very basics like food, housing, medical care, and heating?: Not very hard  Food Insecurity: Low Risk (09/28/2024)   Received from [facility]   Epic    Within the past 12 months, you worried that your food would run out before you got money to buy more: Never true    Within the past 12 months, the food you bought just didn't last and you didn't have money to get more. : Never true  Transportation Needs: No Transportation Needs (09/28/2024)   Received from [facility]    In the past 12 months, has lack of reliable transportation kept you from medical appointments, meetings, work or from getting things needed for daily living? : No  Physical Activity: Not on file  Stress: Not on file  Social Connections: Patient Declined (09/28/2024)   Received from [facility]   Social Connection and Isolation Panel    In a typical week, how many times do you talk on the phone with family, friends, or neighbors?: Patient declined    How often do you get  together with friends or relatives?: Patient declined    How often do you attend church or religious services?: Patient declined    Do you belong to any clubs or organizations such as church groups, unions, fraternal or athletic groups, or school groups?: Patient declined    How often do you attend meetings of the clubs or organizations you belong to?: Patient declined    Are you married, widowed, divorced, separated, never married, or living with a partner?: Patient declined  Intimate Partner Violence: Not on file  Depression (EYV7-0): Not on file  Alcohol Screen: Not on file  Housing: Low  Risk (09/28/2024)   Received from [facility]   Epic    What is your living situation today?: I have a steady place to live    Think about the place you live. Do you have problems with any of the following? Choose all that apply:: None/None on this list  Utilities: Low Risk (09/28/2024)   Received from [facility]   Utilities    In the past 12 months has the electric, gas, oil, or water company threatened to shut off services in your home? : No  Health Literacy: Not on file    Family History:   No family history on file.   ROS:  Please see the history of present illness.   All other ROS reviewed and negative.     Physical Exam/Data: Vitals:   10/09/24 0830 10/09/24 0850 10/09/24 1000 10/09/24 1045  BP:   (!) 151/106   Pulse: (!) 109 84  93  Resp: (!) 22 (!) 24 (!) 23 (!) 23  Temp:      TempSrc:      SpO2: 93% 95%  100%    Intake/Output Summary (Last 24 hours) at 10/09/2024 1121 Last data filed at 10/09/2024 9372 Gross per 24 hour  Intake --  Output 1075 ml  Net -1075 ml      05/05/2021   10:57 AM 03/21/2021    8:59 AM  Last 3 Weights  Weight (lbs) 205 lb 221 lb 8 oz  Weight (kg) 92.987 kg 100.472 kg     There is no height or weight on file to calculate BMI.  General: Thin elderly male. Sitting comfortably in the bed. No acute distress  HEENT: normal Neck: no JVD Vascular:  Radial pulses 2+ bilaterally Cardiac:  normal S1, S2; irregular rate and rhythm, no murmurs  Lungs:  clear to auscultation bilaterally, no wheezing, rhonchi or rales. Normal Wob on room air  Abd: soft, nontender  Ext: no edema in BLE Musculoskeletal:  No deformities  Skin: warm and dry  Neuro:  CNs 2-12 intact, no focal abnormalities noted Psych:  Normal affect   EKG:  The EKG was personally reviewed and demonstrates:  MAT, HR 107 BPM  Telemetry:  Telemetry was personally reviewed and demonstrates:  MAT, HR in the 90s-100s   Relevant CV Studies:  Echocardiogram 09/[facility]/24 ([facility]) SUMMARY  The left ventricular size is normal.  Mild left ventricular hypertrophy  Left ventricular systolic function is mildly reduced.  LV ejection fraction = 40-45% [similar to prior echo]  Left ventricular filling pattern is pseudonormal.  There are regional wall motion abnormalities as specified below.  There is severe hypokiensis-akinesis of the basal to mid inferior-inferolateral, and anterolateral  wall.  The right ventricle is normal in size and function.  The left atrium is severely dilated.  The right atrium is mildly dilated.  There is a mechanical aortic valve.  The prosthetic aortic valve is well-seated with normal function  There is mild to moderate mitral regurgitation.  There is mild tricuspid regurgitation.  No pulmonary hypertension.  The aortic sinus is normal size.  IVC size was normal.  There is no pericardial effusion.  Probably no significant change in comparison with the prior study noted   Laboratory Data: High Sensitivity Troponin:  No results for input(s): TROPONINIHS in the last 720 hours.  Recent Labs  Lab 10/08/24 1602 10/08/24 2018 10/08/24 2201 10/09/24 0319  TRNPT 102* 106* 120* 108*      Chemistry Recent Labs  Lab 10/08/24 1346 10/08/24  1602 10/09/24 0319  NA 136  --  138  K 4.1  --  4.3  CL 101  --  100  CO2 22  --  23  GLUCOSE 232*  --  215*  BUN  35*  --  36*  CREATININE 1.20  --  1.42*  CALCIUM  9.1  --  9.2  MG  --  1.4* 2.3  GFRNONAA >60  --  50*  ANIONGAP 13  --  15    Recent Labs  Lab 10/08/24 1346 10/09/24 0319  PROT 5.7* 6.0*  ALBUMIN 3.5 3.7  AST 34 39  ALT 52* 55*  ALKPHOS 74 81  BILITOT 0.6 0.6   Lipids  Recent Labs  Lab 10/09/24 0319  CHOL 92  TRIG 85  HDL 30*  LDLCALC 45  CHOLHDL 3.1    Hematology Recent Labs  Lab 10/08/24 1218 10/09/24 0335  WBC 7.1 8.7  RBC 5.04 4.43  HGB 15.4 13.5  HCT 46.6 40.8  MCV 92.5 92.1  MCH 30.6 30.5  MCHC 33.0 33.1  RDW 14.1 14.1  PLT 276 268   Thyroid No results for input(s): TSH, FREET4 in the last 168 hours.  BNP Recent Labs  Lab 10/08/24 1347  PROBNP 6,048.0*    DDimer No results for input(s): DDIMER in the last 168 hours.  Radiology/Studies:  [facility] Chest Port 1 View Result Date: 10/08/2024 CLINICAL DATA:  Shortness of breath 2 weeks. EXAM: PORTABLE CHEST 1 VIEW COMPARISON:  03/21/2021 FINDINGS: Sternotomy wires unchanged. Lungs are somewhat hypoinflated demonstrate a small right effusion as well as minimal bibasilar opacification which may be due to atelectasis or infection. Calcified granuloma over the left midlung unchanged. Cardiomediastinal silhouette and remainder of the exam is unchanged. IMPRESSION: Small right effusion with minimal bibasilar opacification which may be due to atelectasis or infection. Electronically Signed   By: Toribio Agreste M.D.   On: 10/08/2024 12:09     Assessment and Plan:  Acute on chronic HFmrEF  - Ech[facility]ogram on 09/28/24 [facility] showed EF 40-45% with wall motion abnormalities. Patient had previously had EF 50-55% with wall motion abnormalities on echo in 04/2024. Was offered stress test at that time but declined due to lack of chest pain or DOE  - [facility] with CHF. Treated with diuresis  - Presented yesterday with SOB on exertion. Denies orthopnea. Mild ankle edema. Pro BNP  elevated to 6048. CXR showed small R effusion with minimal bibasilar opacification  - Patient was given one dose IV lasix  60 mg yesterday- output 1.08 L urine. Creatinine bumped from 1.20>1.42  - Appears euvolemic on exam today. With bump in creatinine, hold lasix  today - Plan to start PO lasix  20 mg daily tomorrow (increase from his home 10 mg daily)  - Resume home metoprolol  tartrate 25 mg BID  - Patient was on lisinopril-hydrochlorothiazide prior to admission. Hold this, start losartan  25 mg daily with plans to eventually get on entresto  - Patient would benefit from ischemic evaluation. He has previously discussed this with his primary cardiologist. He is currently not having any chest pain and his breathing has improved with diuresis. Suspect patient can discuss outpatient eval with his primary cardiologist.    PAF  MAT  - Patient was in afib during [facility] admission [facility]. Appears to be in MAT vs sinus with frequent ectopy now. HR is irregular but there are P waves present  - Resume home metoprolol  tartrate 50 mg BID  - Continue  coumadin    S/p mechanical AVR  Mitral Regurgitation  - 2001  - [facility] this month showed mild-moderate MR, normal function of the aortic valve prosthesis  - Followed by primary cardiologist as an outpatient  - Continue coumadin    Elevated Troponin  - hsTn 102>106>120>108  - Patient denies chest pain  - Presentation not concerning for ACS. Likely demand from CHF, tachycardia   - As above, will defer ischemic eval to primary cardiologist    Risk Assessment/Risk Scores:  New York  Heart Association (NYHA) Functional Class NYHA Class II  CHA2DS2-VASc Score = 5  This indicates a 7.2% annual risk of stroke. The patient's score is based upon: CHF History: 1 HTN History: 1 Diabetes History: 1 Stroke History: 0 Vascular Disease History: 0 Age Score: 2 Gender Score: 0   For questions or updates, please contact [facility] Please consult [email or web address] for contact info under   Signed, Rollo FABIENE Louder, PA-C  10/09/2024 11:21 AM  I have seen and examined the patient along with Rollo FABIENE Louder, PA-C.  I have reviewed the chart, notes and new data.  I agree with PA/NP's note.  Key new complaints: Feeling much better after diuretics.  Breathing is back to baseline. Key examination changes: Irregular baseline rhythm with frequent ectopic beats.  Crisp prosthetic valve clicks without diastolic murmurs.  Very short systolic ejection murmur.  No edema.  No JVD.  Clear lungs. Key new findings / data: Mild increase in creatinine and BUN suggest we cannot be more aggressive with diuretics at this time.  He did not have coronary stenoses at his cardiac catheterization prior to AVR, but this was more than 20 years ago.  PLAN: Acute on chronic heart failure with mildly reduced ejection fraction in a patient with mechanical aortic valve prosthesis and a history of paroxysmal atrial fibrillation.  He is not in atrial fibrillation but is having frequent and sequential PACs. Improved after diuretics and will need a higher dose of maintenance loop diuretic at home.  Also added angiotensin receptor blocker with the option to upgrade to sacubitril/valsartan down the road.    Offered additional workup here but he prefers to follow-up with his primary cardiologist provider which is currently Joseph Sella, NP in [facility] (used to see Dr. McGukin for many years until his retirement).  She has already told him that if the echocardiogram shows depressed ejection fraction she was planning to set him up for a stress test to evaluate for coronary disease.    I tried unsuccessfully to help Joseph Padilla get in touch with his family and update them on his current cardiac status (he gave me his daughter's name Joseph Padilla and her address, but does not have her phone number; only listed contact in the computer system is his  deceased wife Joseph Padilla).  His family is aware that he is in the emergency room since his grandson' s girlfriend has visited him in the emergency room earlier today.  [facility] will sign off.   Medication Recommendations:   Losartan  25 mg once daily (new medication) Furosemide  40 mg once daily, starting tomorrow (new medication) Metoprolol  tartrate 50 mg twice daily Warfarin as previously prescribed Rosuvastatin  20 mg daily Ezetimibe  10 mg daily Other recommendations (labs, testing, etc): Should have a basic metabolic panel performed in 1-2 weeks. Follow up as an outpatient:  Joseph Sella, NP in [facility], MD, [facility] [phone] 10/09/2024, 12:52 PM      [  1] No Known Allergies  "

## 2024-10-09 NOTE — Telephone Encounter (Signed)
 Pharmacy Patient Advocate Encounter  Insurance verification completed.    The patient is insured through [facility]. Patient has Medicare and is not eligible for a copay card, but may be able to apply for patient assistance or Medicare RX Payment Plan (Patient Must reach out to their plan, if eligible for payment plan), if available.    Ran test claim for Generic Entresto 24-26mg  tablet and the current 30 day co-pay is $108.48 due to deductible.  Ran test claim for Jardiance 25mg  tablet and the current 30 day co-pay is $204.57 due to deductible.  Ran test claim for Farxiga 10mg  tablet and the current 30 day co-pay is $182.34 due to deductible.  This test claim was processed through [facility]- copay amounts may vary at other pharmacies due to pharmacy/plan contracts, or as the patient moves through the different stages of their insurance plan.

## 2024-10-09 NOTE — Assessment & Plan Note (Signed)
 Admission BUN 34 w/ sCr 1.2 (N) and GFR >60 (N) BUN 36 w/ sCr 1.42 and GFR 50(L) this morning Received 60 IV Lasix  once in ED since admission - AM BMP, Mg - Strict I&O - Daily weight

## 2024-10-09 NOTE — Assessment & Plan Note (Signed)
 Echo on  09/28/24 at [facility] shows LVEF of 40-45% w/ Serv dilated RV.  Received 60 IV Lasix  once on ED since admission HIV, RPR Neg B12, Lipid panel WNL  ALT has been stable around 50s I/O Neg 1075 ml last 24 hrs. - Cardiology consult, appreciate recommendations - Starting back on Home metoprolol  50 mg BID  - Vital signs per floor - Daily weight - Strict I&O  - Continue home Ezetimibe  10 mg and Rovastatin 20 mg at bedtime  - Pharmacist consulted for Coumadin  management INR 3.5 w/ PTT 36.7 this AM - Pain control: Tylenol  PRN - Bowel Reg: MiraLax , Senakot  - AM Labs: CBC, BMP, Mg - Fall precautions - Delirium precautions - PT/OT consult w/ pending recommendation

## 2024-10-09 NOTE — Assessment & Plan Note (Addendum)
 DMII - A1c 8.2 BG ACHS , continue SSI. 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 

## 2024-10-09 NOTE — Progress Notes (Addendum)
 PHARMACY - ANTICOAGULATION CONSULT NOTE  Pharmacy Consult for warfarin Indication: atrial fibrillation/mechanical aortic valve  Allergies[1]  Patient Measurements:    Vital Signs: Temp: 97.9 F (36.6 C) (01/14 0555) Temp Source: Oral (01/14 0555) BP: 131/96 (01/14 0800) Pulse Rate: 84 (01/14 0850)  Labs: Recent Labs    10/08/24 1218 10/08/24 1346 10/09/24 0319 10/09/24 0335  HGB 15.4  --   --  13.5  HCT 46.6  --   --  40.8  PLT 276  --   --  268  APTT  --   --  43*  --   LABPROT 35.9*  --  36.7*  --   INR 3.4*  --  3.5*  --   CREATININE  --  1.20 1.42*  --     CrCl cannot be calculated (Unknown ideal weight.).   Medical History: Past Medical History:  Diagnosis Date   Diabetes mellitus type 2 in obese (HCC)    History of mechanical aortic valve replacement    valve    Hypertension     Medications:  (Not in a hospital admission)   Assessment: 84 yo M presenting with SOB. On warfarin PTA for afib and mechanical aortic valve replacement (2016). PTA regimen: 5mg  PO MWF, 7.5mg  PO T/Thu/Sa/Su. Pharmacy consulted to dose warfarin.  INR today 3.5, Patient did not receive dose yesterday. Will give a dose of 7.5 given missed dose. CBC stable  Goal of Therapy:  INR 2.5-3.5 Monitor platelets by anticoagulation protocol: Yes   Plan:  Warfarin 7.5mg  PO x1 at 1600 Daily CBC, PT/INR  R. Samual Satterfield, PharmD PGY-1 Acute Care Pharmacy Resident [facility] Please refer to [facility] for [facility] numbers 10/09/2024 1:56 PM      [1] No Known Allergies

## 2024-10-09 NOTE — Evaluation (Signed)
 Physical Therapy Evaluation Patient Details Name: Joseph Padilla MRN: [id number] DOB: May 16, 1941 Today's Date: 10/09/2024  History of Present Illness  Pt is an 84 y.o. M presenting to [facility] on 10/08/24 with SOB. Pt admitted with CHF exacerbation. PMHx significant for A-fib, HTN, HLD, DMII, and mechanical aortic valve replacement.  Clinical Impression  Prior to admittance, pt was independent with mobility and ADLs. Pt presents to evaluation with deficits in mobility, power, balance, and activity tolerance. Pt was able to ambulate community level distances without an AD and no physical assistance, and negotiate stairs with single UE support and no physical assistance. Pt demonstrates good control throughout mobility with no signs of instability or losses of balance noted. PT will continue to treat pt while he is admitted. Anticipate no follow up PT needs at discharge.         If plan is discharge home, recommend the following: Assist for transportation   Can travel by private vehicle        Equipment Recommendations None recommended by PT  Recommendations for Other Services       Functional Status Assessment Patient has had a recent decline in their functional status and demonstrates the ability to make significant improvements in function in a reasonable and predictable amount of time.     Precautions / Restrictions Precautions Precautions: Fall Recall of Precautions/Restrictions: Intact Restrictions Weight Bearing Restrictions Per Provider Order: No      Mobility  Bed Mobility Overal bed mobility: Modified Independent             General bed mobility comments: Supine <> sit on L side of bed with HOB elevated. Increased time to complete.    Transfers Overall transfer level: Needs assistance Equipment used: None Transfers: Sit to/from Stand Sit to Stand: Supervision           General transfer comment: Pt completed STS from EOB without AD and no physical assistance,  pushing up from bed with BUE. Increased time to complete.    Ambulation/Gait Ambulation/Gait assistance: Supervision Gait Distance (Feet): 350 Feet Assistive device: None Gait Pattern/deviations: WFL(Within Functional Limits) Gait velocity: functional Gait velocity interpretation: 1.31 - 2.62 ft/sec, indicative of limited community ambulator   General Gait Details: Pt demonstrates reciprocal gait pattern with equal WB through BLE and good foot clearance with heel strike bilaterally. No signs of instability or LOB noted.  Stairs Stairs: Yes Stairs assistance: Contact guard assist Stair Management: One rail Left, Alternating pattern, Forwards Number of Stairs: 12 General stair comments: Pt negotiated 12 stairs, demonstrating an alternating pattern, utilizing L railing for optimal stability. Pt demonstrates good eccentric control upon descent with no signs of instability.  Wheelchair Mobility     Tilt Bed    Modified Rankin (Stroke Patients Only)       Balance Overall balance assessment: Needs assistance Sitting-balance support: No upper extremity supported, Feet supported Sitting balance-Leahy Scale: Good Sitting balance - Comments: seated EOB but demonstrates significant posterior trunk lean when performing MMT on LEs Postural control: Posterior lean Standing balance support: No upper extremity supported, During functional activity Standing balance-Leahy Scale: Good Standing balance comment: able to shift weight throughout gait without any LOBs                             Pertinent Vitals/Pain Pain Assessment Pain Assessment: No/denies pain    Home Living Family/patient expects to be discharged to:: Private residence Living Arrangements: Alone Available Help at  Discharge: Friend(s);Neighbor;Available PRN/intermittently;Family Type of Home: House Home Access: Stairs to enter Entrance Stairs-Rails: Left Entrance Stairs-Number of Steps: 8   Home Layout:  Two level (ping pong and pool tables in basement) Home Equipment: Rolling Walker (2 wheels);Cane - single point;Tub bench;Wheelchair - manual (pt's wife had Alzheimers and owned above DME)      Prior Function Prior Level of Function : Independent/Modified Independent             Mobility Comments: indep without AD ADLs Comments: indep     Extremity/Trunk Assessment   Upper Extremity Assessment Upper Extremity Assessment: Overall WFL for tasks assessed    Lower Extremity Assessment Lower Extremity Assessment: Overall WFL for tasks assessed    Cervical / Trunk Assessment Cervical / Trunk Assessment: Kyphotic  Communication   Communication Communication: No apparent difficulties    Cognition Arousal: Alert Behavior During Therapy: WFL for tasks assessed/performed   PT - Cognitive impairments: No apparent impairments                         Following commands: Intact       Cueing Cueing Techniques: Verbal cues, Gestural cues     General Comments General comments (skin integrity, edema, etc.): HR: 89-121 bpm, SpO2 90-93% on RA    Exercises     Assessment/Plan    PT Assessment Patient needs continued PT services  PT Problem List Decreased balance;Decreased mobility       PT Treatment Interventions DME instruction;Gait training;Stair training;Functional mobility training;Therapeutic activities;Therapeutic exercise;Balance training;Patient/family education;Manual techniques;Modalities    PT Goals (Current goals can be found in the Care Plan section)  Acute Rehab PT Goals Patient Stated Goal: to go home PT Goal Formulation: With patient Time For Goal Achievement: 10/23/24 Potential to Achieve Goals: Good Additional Goals Additional Goal #1: Pt will score > 19 on DGI demonstrating improved balance and reduced risk of falls    Frequency Min 1X/week     Co-evaluation               AM-PAC PT 6 Clicks Mobility  Outcome Measure Help  needed turning from your back to your side while in a flat bed without using bedrails?: None Help needed moving from lying on your back to sitting on the side of a flat bed without using bedrails?: None Help needed moving to and from a bed to a chair (including a wheelchair)?: A Little Help needed standing up from a chair using your arms (e.g., wheelchair or bedside chair)?: A Little Help needed to walk in hospital room?: A Little Help needed climbing 3-5 steps with a railing? : A Little 6 Click Score: 20    End of Session Equipment Utilized During Treatment: Gait belt Activity Tolerance: Patient tolerated treatment well Patient left: in chair;with call bell/phone within reach;with nursing/sitter in room Nurse Communication: Mobility status PT Visit Diagnosis: Other abnormalities of gait and mobility (R26.89)    Time: 1559-1630 PT Time Calculation (min) (ACUTE ONLY): 31 min   Charges:   PT Evaluation $PT Eval Low Complexity: 1 Low   PT General Charges $$ ACUTE PT VISIT: 1 Visit         Leontine Hilt DPT Acute Rehab Services [phone] Prefer contact via chat   Leontine NOVAK Cassie Shedlock 10/09/2024, 4:54 PM

## 2024-10-09 NOTE — Progress Notes (Signed)
 Heart Failure Navigator Progress Note  Assessed for Heart & Vascular TOC clinic readiness.  Patient does not meet criteria due to he is seen by [facility]. No HF TOC. .   Navigator  will sign off at this time.   Stephane Haddock, BSN, Scientist, Clinical (histocompatibility And Immunogenetics) Only

## 2024-10-10 ENCOUNTER — Other Ambulatory Visit (HOSPITAL_COMMUNITY): Payer: Self-pay

## 2024-10-10 DIAGNOSIS — I5043 Acute on chronic combined systolic (congestive) and diastolic (congestive) heart failure: Secondary | ICD-10-CM

## 2024-10-10 LAB — BASIC METABOLIC PANEL WITH GFR
Anion gap: 10 (ref 5–15)
BUN: 38 mg/dL — ABNORMAL HIGH (ref 8–23)
CO2: 27 mmol/L (ref 22–32)
Calcium: 8.9 mg/dL (ref 8.9–10.3)
Chloride: 99 mmol/L (ref 98–111)
Creatinine, Ser: 1.67 mg/dL — ABNORMAL HIGH (ref 0.61–1.24)
GFR, Estimated: 41 mL/min — ABNORMAL LOW
Glucose, Bld: 202 mg/dL — ABNORMAL HIGH (ref 70–99)
Potassium: 4.6 mmol/L (ref 3.5–5.1)
Sodium: 137 mmol/L (ref 135–145)

## 2024-10-10 LAB — MAGNESIUM: Magnesium: 2 mg/dL (ref 1.7–2.4)

## 2024-10-10 LAB — GLUCOSE, CAPILLARY: Glucose-Capillary: 170 mg/dL — ABNORMAL HIGH (ref 70–99)

## 2024-10-10 LAB — PROTIME-INR
INR: 2.9 — ABNORMAL HIGH (ref 0.8–1.2)
Prothrombin Time: 31.7 s — ABNORMAL HIGH (ref 11.4–15.2)

## 2024-10-10 MED ORDER — FUROSEMIDE 20 MG PO TABS
20.0000 mg | ORAL_TABLET | Freq: Every day | ORAL | 0 refills | Status: DC
  Filled 2024-10-10: qty 30, 30d supply, fill #0

## 2024-10-10 MED ORDER — FUROSEMIDE 20 MG PO TABS: 20.0000 mg | ORAL_TABLET | Freq: Every day | ORAL | 0 refills | Status: DC

## 2024-10-10 MED ORDER — METOPROLOL TARTRATE 50 MG PO TABS: 50.0000 mg | ORAL_TABLET | Freq: Two times a day (BID) | ORAL | 0 refills | Status: DC

## 2024-10-10 MED ORDER — WARFARIN SODIUM 7.5 MG PO TABS: 7.5000 mg | ORAL_TABLET | Freq: Once | ORAL | Status: DC

## 2024-10-10 MED ORDER — METOPROLOL TARTRATE 50 MG PO TABS
50.0000 mg | ORAL_TABLET | Freq: Two times a day (BID) | ORAL | 0 refills | Status: DC
  Filled 2024-10-10: qty 60, 30d supply, fill #0

## 2024-10-10 NOTE — Assessment & Plan Note (Signed)
 Admission BUN 34 w/ sCr 1.2 (N) and GFR >60 (N) BUN 36 w/ sCr 1.42 and GFR 50(L) this morning Received 60 IV Lasix  once in ED since admission - AM BMP, Mg - Strict I&O - Daily weight

## 2024-10-10 NOTE — Evaluation (Signed)
 Occupational Therapy Evaluation Patient Details Name: Joseph Padilla MRN: [id number] DOB: 10/30/1940 Today's Date: 10/10/2024   History of Present Illness   Pt is an 84 y.o. M presenting to [facility] on 10/08/24 with SOB. Pt admitted with CHF exacerbation. PMHx significant for A-fib, HTN, HLD, DMII, and mechanical aortic valve replacement.     Clinical Impressions Joseph Padilla was evaluated s/p the above admission list. He lives alone and is indep at baseline. Upon evaluation, pt demonstrated indep ability to complete mobility and ADLs, no AD needed. No LOB or safety concern noted throughout. Pt does not require further acute, or follow up OT services. Recommend discharge back to pt's environment with assist as needed. OT to sign off with appreciation of order, please re-consult if needed.       If plan is discharge home, recommend the following:   Assistance with cooking/housework     Functional Status Assessment   Patient has had a recent decline in their functional status and demonstrates the ability to make significant improvements in function in a reasonable and predictable amount of time.     Equipment Recommendations   None recommended by OT      Precautions/Restrictions   Precautions Precautions: Fall Recall of Precautions/Restrictions: Intact Restrictions Weight Bearing Restrictions Per Provider Order: No     Mobility Bed Mobility               General bed mobility comments: OOB on arrival    Transfers Overall transfer level: Independent Equipment used: None                      Balance Overall balance assessment: No apparent balance deficits (not formally assessed)             ADL either performed or assessed with clinical judgement   ADL Overall ADL's : Independent;At baseline         Vision Baseline Vision/History: 0 No visual deficits Vision Assessment?: No apparent visual deficits     Perception Perception: Within Functional  Limits       Praxis Praxis: WFL       Pertinent Vitals/Pain Pain Assessment Pain Assessment: No/denies pain     Extremity/Trunk Assessment Upper Extremity Assessment Upper Extremity Assessment: Overall WFL for tasks assessed   Lower Extremity Assessment Lower Extremity Assessment: Overall WFL for tasks assessed   Cervical / Trunk Assessment Cervical / Trunk Assessment: Kyphotic (mild)   Communication Communication Communication: Impaired Factors Affecting Communication: Hearing impaired   Cognition Arousal: Alert Behavior During Therapy: WFL for tasks assessed/performed Cognition: No apparent impairments       Following commands: Intact       Cueing  General Comments   Cueing Techniques: Verbal cues  VSS on RA           Home Living Family/patient expects to be discharged to:: Private residence Living Arrangements: Alone Available Help at Discharge: Friend(s);Neighbor;Available PRN/intermittently;Family Type of Home: House Home Access: Stairs to enter [employer] of Steps: 8 Entrance Stairs-Rails: Left Home Layout: Two level     Bathroom Shower/Tub: Chief Strategy Officer: Handicapped height     Home Equipment: Agricultural Consultant (2 wheels);Cane - single point;Tub bench;Wheelchair - manual          Prior Functioning/Environment Prior Level of Function : Independent/Modified Independent             Mobility Comments: indep without AD ADLs Comments: indep    OT Problem List: Decreased activity tolerance  OT Goals(Current goals can be found in the care plan section)   Acute Rehab OT Goals Patient Stated Goal: home OT Goal Formulation: With patient Time For Goal Achievement: 10/10/24 Potential to Achieve Goals: Good   AM-PAC OT 6 Clicks Daily Activity     Outcome Measure Help from another person eating meals?: None Help from another person taking care of personal grooming?: None Help from another  person toileting, which includes using toliet, bedpan, or urinal?: None Help from another person bathing (including washing, rinsing, drying)?: None Help from another person to put on and taking off regular upper body clothing?: None Help from another person to put on and taking off regular lower body clothing?: None 6 Click Score: 24   End of Session Equipment Utilized During Treatment: Gait belt Nurse Communication: Mobility status  Activity Tolerance: Patient tolerated treatment well Patient left: in chair  OT Visit Diagnosis: Muscle weakness (generalized) (M62.81)                Time: 9091-9074 OT Time Calculation (min): 17 min Charges:  OT General Charges $OT Visit: 1 Visit OT Evaluation $OT Eval Low Complexity: 1 Low  Lucie Kendall, OTR/L Acute Rehabilitation Services Office [phone] Secure Chat Communication Preferred   Lucie JONETTA Kendall 10/10/2024, 9:28 AM

## 2024-10-10 NOTE — Progress Notes (Signed)
 PHARMACY - ANTICOAGULATION CONSULT NOTE  Pharmacy Consult for warfarin Indication: atrial fibrillation/mechanical aortic valve  Allergies[1]  Patient Measurements: Weight: 81.8 kg (180 lb 5.4 oz)  Vital Signs: Temp: 97.5 F (36.4 C) (01/15 0325) Temp Source: Oral (01/15 0325) BP: 136/72 (01/15 0325) Pulse Rate: 82 (01/15 0325)  Labs: Recent Labs    10/08/24 1218 10/08/24 1346 10/09/24 0319 10/09/24 0335 10/10/24 0311  HGB 15.4  --   --  13.5  --   HCT 46.6  --   --  40.8  --   PLT 276  --   --  268  --   APTT  --   --  43*  --   --   LABPROT 35.9*  --  36.7*  --  31.7*  INR 3.4*  --  3.5*  --  2.9*  CREATININE  --  1.20 1.42*  --  1.67*    CrCl cannot be calculated (Unknown ideal weight.).   Medical History: Past Medical History:  Diagnosis Date   Diabetes mellitus type 2 in obese    History of mechanical aortic valve replacement    valve    Hypertension     Medications:  Medications Prior to Admission  Medication Sig Dispense Refill Last Dose/Taking   ezetimibe  (ZETIA ) 10 MG tablet Take 10 mg by mouth daily.   10/07/2024   furosemide  (LASIX ) 20 MG tablet Take 10 mg by mouth daily.   10/08/2024 Morning   metFORMIN (GLUCOPHAGE) 1000 MG tablet Take 1,000 mg by mouth 2 (two) times daily with a meal.   10/08/2024   rosuvastatin  (CRESTOR ) 20 MG tablet Take 20 mg by mouth daily.   10/07/2024   warfarin (COUMADIN ) 5 MG tablet Take 5 mg by mouth at bedtime. Only on Monday , Wednesday, and Friday.   10/07/2024   warfarin (COUMADIN ) 7.5 MG tablet Take 1 tablet (7.5 mg total) by mouth at bedtime. (Patient taking differently: Take 7.5 mg by mouth at bedtime. Only on Tuesday Thursday Saturday and Sunday)   10/06/2024   glimepiride (AMARYL) 4 MG tablet Take 4 mg by mouth daily before breakfast. (Patient not taking: Reported on 10/08/2024)   Not Taking   lisinopril-hydrochlorothiazide (PRINZIDE,ZESTORETIC) 20-12.5 MG per tablet Take 1 tablet by mouth daily. (Patient not taking:  Reported on 10/08/2024)   Not Taking   magnesium  oxide (MAG-OX) 400 MG tablet Take 400 mg by mouth daily. (Patient not taking: Reported on 10/08/2024)   Not Taking   metFORMIN (GLUCOPHAGE) 500 MG tablet Take 500 mg by mouth 2 (two) times daily. (Patient not taking: Reported on 10/08/2024)   Not Taking   metoprolol  (LOPRESSOR ) 100 MG tablet Take 100 mg by mouth daily. (Patient not taking: Reported on 10/08/2024)   Not Taking    Assessment: 84 yo M presenting with SOB. On warfarin PTA for afib and mechanical aortic valve replacement (2016). PTA regimen: 5mg  PO MWF, 7.5mg  PO T/Thu/Sa/Su. Pharmacy consulted to dose warfarin.  INR today 2.9, which is therapeutic. Will give a dose of 7.5 mg today to restart home dosing given therapeutic levels. CBC stable  Goal of Therapy:  INR 2.5-3.5 Monitor platelets by anticoagulation protocol: Yes   Plan:  Warfarin 7.5mg  PO x1 at 1600 Daily CBC, PT/INR  R. Samual Satterfield, PharmD PGY-1 Acute Care Pharmacy Resident [facility] Please refer to [facility] for [facility] Pharmacy numbers 10/10/2024 9:25 AM       [1] No Known Allergies

## 2024-10-10 NOTE — Discharge Summary (Addendum)
 "  Family Medicine Teaching [facility] Discharge Summary  Patient name: Joseph Padilla Medical record number: [id number] Date of birth: [birth year] Age: 84 y.o. Gender: male Date of Admission: 10/08/2024  Date of Discharge: 10/10/2024 Admitting Physician: [place] KATHEE Samuels, DO  Primary Care Provider: Debrah Josette ORN., PA-C Consultants: Cardiology   Indication for Hospitalization: CHF exacerbation   Discharge Diagnoses/Problem List:  Principal Problem for Admission:  Other Problems addressed during stay:  Principal Problem:   CHF exacerbation (HCC) Active Problems:   Chronic health problem   QT prolongation   AKI (acute kidney injury)   SOB (shortness of breath)  The above problem list has been updated and reviewed for accuracy, including the initial reason for admission.  Brief Hospital Course:  Joseph HARDWICK is a 84 y.o.male with a history of  CHF, HTN, Dyslipidemia, and DMII w/ s/p mAVR who was admitted to the [facility] Teaching Service at [facility] for CHF exacerbation. His hospital course is detailed below:  CHF exacerbation Pt was recently admitted at [facility] on 09/27/24 - 09/29/24 with new onset of CHF. TTE at [facility] on 09/28/24 showed EFLV of 40-45%, RA severely dilated, no evidence of pulmonary HTN, w/ mild-mod MR. On admission to [facility] his BNP was 6048, CXR showed small effusion. He was diuresed with IV Lasix  then transitioned to PO; he continued to diurese well. Cardiology consulted during admission and assisted with medication management. He was started on Losartan  25 mg daily w/ plan on eventually transition to Entresto, however ultimately held the Losartan  d/t AKI. He was discharged in stable condition.  A-fib w/ RVR Pt w/ Afib w/ RVR on admission. Hx of A-fib w/ RVR at [facility] (newly diagnosed), had not been taking home Metoprolol . He received IV Metoprolol  with improvement of RVR then transitioned back to home dose Metoprolol  tartate 50 mg BID and continued home dose  coumadin .  AKI Creatinine did increase after diuresis with IV lasix ; as such losartan  was held. He did continue to receive lower dose or oral lasix  and remained stable.   Other chronic conditions were medically managed with home medications and formulary alternatives as necessary  PCP Follow-up Recommendations: F/u with his HR. Pt w/ hx of A-fib w/ RVR; while he remained in a-fib during this admission his rate has been controlled with home dose Metoprolol . Follow up with Cardiologist for ischemia evaluation in outpatient setting. F/u BMP in 1 week - if creatinine improving recommend restarting losartan . F/u PT/INR for Coumadin  use. Monitor weight and consider lasix  titration as needed.    Results/Tests Pending at Time of Discharge:  Unresulted Labs (From admission, onward)     Start     Ordered   10/09/24 0500  Protime-INR  Daily,   R      10/08/24 1715            Disposition: Home  Discharge Condition: Stable   Discharge Exam:  Vitals:   10/09/24 2318 10/10/24 0325  BP: (!) 124/91 136/72  Pulse: 89 82  Resp: 20 20  Temp: (!) 97.4 F (36.3 C) (!) 97.5 F (36.4 C)  SpO2: 97% 95%   Physical Exam Constitutional:      Appearance: He is well-developed.  Cardiovascular:     Rate and Rhythm: Normal rate. Rhythm irregular.  Pulmonary:     Effort: Pulmonary effort is normal.  Abdominal:     Palpations: Abdomen is soft.  Neurological:     Mental Status: He is alert and oriented to person, place, and time.  Significant Procedures: None  Significant Labs and Imaging:  Recent Labs  Lab 10/09/24 0335  WBC 8.7  HGB 13.5  HCT 40.8  PLT 268   Recent Labs  Lab 10/08/24 1346 10/08/24 1602 10/09/24 0319 10/10/24 0311  NA 136  --  138 137  K 4.1  --  4.3 4.6  CL 101  --  100 99  CO2 22  --  23 27  GLUCOSE 232*  --  215* 202*  BUN 35*  --  36* 38*  CREATININE 1.20  --  1.42* 1.67*  CALCIUM  9.1  --  9.2 8.9  MG  --  1.4* 2.3 2.0  ALKPHOS 74  --  81  --    AST 34  --  39  --   ALT 52*  --  55*  --   ALBUMIN 3.5  --  3.7  --    10/09/23 CXR: IMPRESSION: Small right effusion with minimal bibasilar opacification which may be due to atelectasis or infection.   Discharge Medications:  Allergies as of 10/10/2024   No Known Allergies      Medication List     PAUSE taking these medications    glimepiride 4 MG tablet Wait to take this until your doctor or other care provider tells you to start again. Commonly known as: AMARYL Take 4 mg by mouth daily before breakfast.       STOP taking these medications    lisinopril-hydrochlorothiazide 20-12.5 MG tablet Commonly known as: ZESTORETIC       TAKE these medications    ezetimibe  10 MG tablet Commonly known as: ZETIA  Take 10 mg by mouth daily.   furosemide  20 MG tablet Commonly known as: Lasix  Take 1 tablet (20 mg total) by mouth daily. What changed: how much to take   magnesium  oxide 400 MG tablet Commonly known as: MAG-OX Take 400 mg by mouth daily.   metFORMIN 1000 MG tablet Commonly known as: GLUCOPHAGE Take 1,000 mg by mouth 2 (two) times daily with a meal. What changed: Another medication with the same name was removed. Continue taking this medication, and follow the directions you see here.   metoprolol  tartrate 50 MG tablet Commonly known as: LOPRESSOR  Take 1 tablet (50 mg total) by mouth 2 (two) times daily. What changed:  medication strength how much to take when to take this   rosuvastatin  20 MG tablet Commonly known as: CRESTOR  Take 20 mg by mouth daily.   warfarin 5 MG tablet Commonly known as: COUMADIN  Take 5 mg by mouth at bedtime. Only on Monday , Wednesday, and Friday. What changed: Another medication with the same name was changed. Make sure you understand how and when to take each.   warfarin 7.5 MG tablet Commonly known as: COUMADIN  Take 1 tablet (7.5 mg total) by mouth at bedtime. What changed: additional instructions         Discharge Instructions: Please refer to Patient Instructions section of EMR for full details.  Patient was counseled important signs and symptoms that should prompt return to medical care, changes in medications, dietary instructions, activity restrictions, and follow up appointments.   Follow-Up Appointments:  Follow-up Information     Debrah Josette ORN., PA-C. Schedule an appointment as soon as possible for a visit on 10/16/2024.   Specialty: Family Medicine Why: will see Joseph Padilla at 9:20 for hospital follow up , Contact information: [address] [place] [address] [phone]  Suzen Elder B, DO 10/10/2024, 12:36 PM PGY-1, [facility]    Upper Level Addendum: I have seen and evaluated this patient along with Dr. Coralee and reviewed the above note, making necessary revisions as appropriate. I agree with the medical decision making and physical exam as noted above.  Lauraine Norse, DO [facility], PGY-2 10/10/24 12:49 PM   FPTS Intern pager: [phone], text pages welcome Secure chat group [facility] Teaching Service   "

## 2024-10-10 NOTE — Assessment & Plan Note (Signed)
 DMII - A1c 8.2 BG ACHS , continue SSI. 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 

## 2024-10-10 NOTE — Progress Notes (Signed)
 Mobility Specialist Progress Note:    10/10/24 0953  Mobility  Activity Ambulated with assistance  Level of Assistance Independent after set-up  Assistive Device None  Distance Ambulated (ft) 200 ft  Range of Motion/Exercises Active  Activity Response Tolerated well  Mobility Referral Yes  Mobility visit 1 Mobility  Mobility Specialist Start Time (ACUTE ONLY) 0953  Mobility Specialist Stop Time (ACUTE ONLY) 1001  Mobility Specialist Time Calculation (min) (ACUTE ONLY) 8 min   Received pt ambulating in room agreeable to hallway ambulation. No c/o any symptoms. Pt moving and ambulating well w/ the ability to do multiple task in between. Returned pt to room w/ all needs met.   Venetia Keel Mobility Specialist Please Neurosurgeon or Rehab Office at [phone]

## 2024-10-10 NOTE — Assessment & Plan Note (Signed)
 Echo on  09/28/24 at [facility] shows LVEF of 40-45% w/ Serv dilated RV.  Received 60 IV Lasix  once on ED since admission HIV, RPR Neg B12, Lipid panel WNL  ALT has been stable around 50s I/O Neg 200 ml last 24 hrs. - Cardiology consult, appreciate recommendations  - Continue PO Lasix  20 mg daily inc from 10 mg home dose   - Continue home Metoprolol  50 mg BID  - Continue to hold lisinopril-hydrochlorothiazide from prior to admission   - Considering starting Losartan  25 mg Daily if AKI improve at f/u visit with plans to eventually get on entresto   - Recommending outpatient ischemic work up  - Vital signs per floor - Daily weight - Strict I&O  - Continue home Ezetimibe  10 mg and Rovastatin 20 mg at bedtime  - Pharmacist consulted for Coumadin  management INR 3.5 w/ PTT 36.7 this AM - Pain control: Tylenol  PRN - Bowel Reg: MiraLax , Senakot  - AM Labs: CBC, BMP, Mg - Fall precautions - PT/OT consult

## 2024-10-10 NOTE — Discharge Instructions (Addendum)
 Dear Joseph Padilla,  Thank you for letting us  participate in your care. You were hospitalized for worsening SOB and diagnosed with CHF exacerbation (HCC).   POST-HOSPITAL & CARE INSTRUCTIONS Go to your follow up appointments  Follow up with your PCP in 1 week  DOCTOR'S APPOINTMENT   No future appointments.   Take care and be well!  Family Medicine Teaching Service Inpatient Team [place]  [facility]  [address] [place], [place] [address] [phone]

## 2024-10-10 NOTE — TOC CM/SW Note (Signed)
 Transition of Care [facility]) - Inpatient Brief Assessment   Patient Details  Name: Joseph Padilla MRN: [id number] Date of Birth: 15-Feb-1941  Transition of Care [facility]) CM/SW Contact:    Waddell Barnie Rama, RN Phone Number: 10/10/2024, 11:19 AM   Clinical Narrative: From home alone, has PCP and insurance on file, states has no HH services in place at this time or DME at home.  States family member  (daughter ) will transport them home at dc or may need a cab and family is support system, states gets medications from [facility] on [address].  Pta self ambulatory.   There are no ICM  needs identified  at this time.  Please place consult for ICM needs.     Transition of Care Asessment: Insurance and Status: Insurance coverage has been reviewed Patient has primary care physician: Yes Home environment has been reviewed: home alone Prior level of function:: indep Prior/Current Home Services: No current home services Social Drivers of Health Review: SDOH reviewed no interventions necessary Readmission risk has been reviewed: Yes Transition of care needs: no transition of care needs at this time

## 2024-10-10 NOTE — Plan of Care (Signed)
   Problem: Education: Goal: Knowledge of General Education information will improve Description: Including pain rating scale, medication(s)/side effects and non-pharmacologic comfort measures Outcome: Progressing   Problem: Clinical Measurements: Goal: Cardiovascular complication will be avoided Outcome: Progressing

## 2024-10-10 NOTE — Progress Notes (Signed)
 "    Daily Progress Note Intern Pager: [phone]  Patient name: Joseph Padilla Medical record number: [id number] Date of birth: 1940/12/06 Age: 84 y.o. Gender: male  Primary Care Provider: Debrah Josette ORN., PA-C Consultants: Cardiology  Code Status: Full Code  Pt Overview and Major Events to Date:  1/13 : Admitted  Joseph Padilla is a 84 YO male w/ PMH of HTN, HLD, DMII who recently hospitalize for ne CHF dx w/ Echo 40-45% at [facility] w/ s/p mTAVR. He has been admitted for CHF exacerbation    Assessment & Plan CHF exacerbation (HCC) Echo on  09/28/24 at [facility] shows LVEF of 40-45% w/ Serv dilated RV.  Received 60 IV Lasix  once on ED since admission HIV, RPR Neg B12, Lipid panel WNL  ALT has been stable around 50s I/O Neg 200 ml last 24 hrs. - Cardiology consult, appreciate recommendations  - Continue PO Lasix  20 mg daily inc from 10 mg home dose   - Continue home Metoprolol  50 mg BID  - Continue to hold lisinopril-hydrochlorothiazide from prior to admission   - Considering starting Losartan  25 mg Daily if AKI improve at f/u visit with plans to eventually get on entresto   - Recommending outpatient ischemic work up  - Vital signs per floor - Daily weight - Strict I&O  - Continue home Ezetimibe  10 mg and Rovastatin 20 mg at bedtime  - Pharmacist consulted for Coumadin  management INR 3.5 w/ PTT 36.7 this AM - Pain control: Tylenol  PRN - Bowel Reg: MiraLax , Senakot  - AM Labs: CBC, BMP, Mg - Fall precautions - PT/OT consult  AKI (acute kidney injury) Admission BUN 34 w/ sCr 1.2 (N) and GFR >60 (N) BUN 36 w/ sCr 1.42 and GFR 50(L) this morning Received 60 IV Lasix  once in ED since admission - AM BMP, Mg - Strict I&O - Daily weight  Chronic health problem DMII - A1c 8.2 BG ACHS , continue SSI. 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 Ready to go home   Objective: Temp:  [97.3 F (36.3 C)-97.5 F (36.4 C)] 97.5 F (36.4 C) (01/15 0325) Pulse Rate:  [41-120] 82 (01/15 0325) Resp:  [20-32] 20 (01/15 0325) BP: (106-153)/(57-118) 136/72 (01/15 0325) SpO2:  [93 %-100 %] 95 % (01/15 0325) Weight:  [81.8 kg] 81.8 kg (01/15 0325)  Physical Exam Constitutional:      Appearance: He is well-developed.  Cardiovascular:     Rate and Rhythm: Normal rate. Rhythm irregular.  Pulmonary:     Effort: Pulmonary effort is normal.  Abdominal:     Palpations: Abdomen is soft.  Neurological:     Mental Status: He is alert and oriented to person, place, and time.     Laboratory: Most recent CBC Lab Results  Component Value Date   WBC 8.7 10/09/2024   HGB 13.5 10/09/2024   HCT 40.8 10/09/2024   MCV 92.1 10/09/2024   PLT 268 10/09/2024   Most recent BMP    Latest Ref Rng & Units 10/10/2024    3:11 AM  BMP  Glucose 70 - 99 mg/dL 797   BUN 8 - 23 mg/dL 38   Creatinine 9.38 - 1.24  mg/dL 8.32   Sodium 864 - 854 mmol/L 137   Potassium 3.5 - 5.1 mmol/L 4.6   Chloride 98 - 111 mmol/L 99   CO2 22 - 32 mmol/L 27   Calcium  8.9 - 10.3 mg/dL 8.9      Suzen Elder B, DO 10/10/2024, 9:49 AM  PGY-1, [facility] FPTS Intern pager: [phone], text pages welcome Secure chat group [facility] Teaching Service   "

## 2024-10-10 NOTE — TOC Transition Note (Addendum)
 Transition of Care [facility]) - Discharge Note   Patient Details  Name: WALTON DIGILIO MRN: [id number] Date of Birth: January 01, 1941  Transition of Care [facility]) CM/SW Contact:  Waddell Barnie Rama, RN Phone Number: 10/10/2024, 11:20 AM   Clinical Narrative:    For dc today, has no needs. May need a cab if can't get in touch with daughter. NCM tried to call daughter, no answer, he will need a cab in dc lounge.         Patient Goals and CMS Choice            Discharge Placement                       Discharge Plan and Services Additional resources added to the After Visit Summary for                                       Social Drivers of Health (SDOH) Interventions SDOH Screenings   Food Insecurity: No Food Insecurity (10/09/2024)  Housing: Low Risk (10/09/2024)  Transportation Needs: No Transportation Needs (10/09/2024)  Utilities: Not At Risk (10/09/2024)  Financial Resource Strain: Low Risk (09/28/2024)   Received from [facility]  Social Connections: Socially Isolated (10/09/2024)  Tobacco Use: Low Risk (10/09/2024)  Recent Concern: Tobacco Use - Medium Risk (10/04/2024)   Received from [facility]     Readmission Risk Interventions    10/10/2024   11:18 AM  Readmission Risk Prevention Plan  Post Dischage Appt Complete  Medication Screening Complete  Transportation Screening Complete

## 2024-10-25 ENCOUNTER — Emergency Department (HOSPITAL_COMMUNITY)
Admission: EM | Admit: 2024-10-25 | Discharge: 2024-10-27 | Disposition: E | Attending: Emergency Medicine | Admitting: Emergency Medicine

## 2024-10-25 DIAGNOSIS — I5020 Unspecified systolic (congestive) heart failure: Secondary | ICD-10-CM | POA: Insufficient documentation

## 2024-10-25 DIAGNOSIS — Z7984 Long term (current) use of oral hypoglycemic drugs: Secondary | ICD-10-CM | POA: Insufficient documentation

## 2024-10-25 DIAGNOSIS — E119 Type 2 diabetes mellitus without complications: Secondary | ICD-10-CM | POA: Diagnosis not present

## 2024-10-25 DIAGNOSIS — Z7901 Long term (current) use of anticoagulants: Secondary | ICD-10-CM | POA: Diagnosis not present

## 2024-10-25 DIAGNOSIS — I110 Hypertensive heart disease with heart failure: Secondary | ICD-10-CM | POA: Insufficient documentation

## 2024-10-25 DIAGNOSIS — I4891 Unspecified atrial fibrillation: Secondary | ICD-10-CM | POA: Diagnosis not present

## 2024-10-25 DIAGNOSIS — I469 Cardiac arrest, cause unspecified: Secondary | ICD-10-CM | POA: Insufficient documentation

## 2024-10-25 DIAGNOSIS — Z79899 Other long term (current) drug therapy: Secondary | ICD-10-CM | POA: Insufficient documentation

## 2024-10-25 LAB — CBG MONITORING, ED: Glucose-Capillary: 290 mg/dL — ABNORMAL HIGH (ref 70–99)

## 2024-10-27 DIAGNOSIS — 419620001 Death: Secondary | SNOMED CT | POA: Diagnosis not present

## 2024-10-27 NOTE — ED Provider Notes (Signed)
 " [place] EMERGENCY DEPARTMENT AT [facility] Provider Note   CSN: [id number] Arrival date & time: 11-13-24  1709     Patient presents with: No chief complaint on file.   Joseph Padilla is a 84 y.o. male with past medical history notable for HFrEF, hypertension, hyperlipidemia, type 2 diabetes, A-fib who presents today for evaluation of cardiac arrest.  Per EMS report, neighbors found him down outside on his driveway for an unknown period of time.  On initial EMS assessment patient was noted to be cyanotic and cool.  No palpable pulses and CPR initiated.  CPR started time at approximately 1600.  During transport patient did have a brief period of ROSC for approximately 2 minutes 30 minutes prior to arrival however has been otherwise ongoing arrest.  Patient has had V-fib and V. tach with intermittent periods of PEA and asystole.  Status post multiple shocks.  450 mg of amiodarone.  9 rounds of epinephrine.  HPI     Prior to Admission medications  Medication Sig Start Date End Date Taking? Authorizing Provider  ezetimibe  (ZETIA ) 10 MG tablet Take 10 mg by mouth daily.    [provider]  furosemide  (LASIX ) 20 MG tablet Take 1 tablet (20 mg total) by mouth daily. 10/10/24 10/10/25  Lafe Domino, DO  [Paused] glimepiride (AMARYL) 4 MG tablet Take 4 mg by mouth daily before breakfast. Patient not taking: Reported on 10/08/2024 Wait to take this until your doctor or other care provider tells you to start again.    [provider]  magnesium  oxide (MAG-OX) 400 MG tablet Take 400 mg by mouth daily. Patient not taking: Reported on 10/08/2024 09/29/24 10/29/24  [provider]  metFORMIN (GLUCOPHAGE) 1000 MG tablet Take 1,000 mg by mouth 2 (two) times daily with a meal.    [provider]  metoprolol  tartrate (LOPRESSOR ) 50 MG tablet Take 1 tablet (50 mg total) by mouth 2 (two) times daily. 10/10/24   Lafe Domino, DO  rosuvastatin  (CRESTOR ) 20 MG tablet  Take 20 mg by mouth daily.    [provider]  warfarin (COUMADIN ) 5 MG tablet Take 5 mg by mouth at bedtime. Only on Monday , Wednesday, and Friday.    [provider]  warfarin (COUMADIN ) 7.5 MG tablet Take 1 tablet (7.5 mg total) by mouth at bedtime. Patient taking differently: Take 7.5 mg by mouth at bedtime. Only on Tuesday Thursday Saturday and Sunday 03/24/21   Raenelle Donalda HERO, MD    Allergies: Patient has no known allergies.    Review of Systems  Updated Vital Signs Pulse (!) 103   Temp (!) 91 F (32.8 C) (Rectal)   Resp (!) 33   SpO2 (!) 82%   Physical Exam Constitutional:      Appearance: He is toxic-appearing.  HENT:     Head: Normocephalic and atraumatic.     Right Ear: External ear normal.     Left Ear: External ear normal.     Nose: Nose normal.     Mouth/Throat:     Mouth: Mucous membranes are moist.     Comments: ET tube in place with bloody secretions in the oropharynx Eyes:     Comments: Dilated, nonreactive pupils  Neck:     Comments: C-collar in place Cardiovascular:     Comments: CPR in progress Pulmonary:     Comments: BVM assisted ventilations through ET tube Abdominal:     General: Abdomen is flat. There is no distension.  Palpations: Abdomen is soft.  Musculoskeletal:        General: No tenderness. Normal range of motion.  Skin:    Coloration: Skin is pale.     Comments: Cold     (all labs ordered are listed, but only abnormal results are displayed) Labs Reviewed  CBG MONITORING, ED - Abnormal; Notable for the following components:      Result Value   Glucose-Capillary 290 (*)    All other components within normal limits    EKG: None  Radiology: No results found.  Procedures   Medications Ordered in the ED - No data to display                               Medical Decision Making  Patient is an 84 year old male who presents today for evaluation of cardiac arrest.  On initial assessment patient  arrives with EMS with a [place] device on.  Initial pulse check without any palpable pulses and PEA on the monitor.  CPR was subsequently reinitiated.  I did confirm ET tube placement with direct visualization on video endoscope.  End-tidal CO2 with appropriate waveform and measuring approximately 18-24.  Core temperature measured at 91 degrees.  CPR continued care in the emergency department for 3 additional rounds all without pulses during pulse check and PEA on the monitor.  No evidence of organized cardiac activity.  BGL 290.  At this point in time I feel that we have corrected for potential reversible causes of his cardiac arrest.  Patient is still hypothermic however above 90 F where I do not think that this is the primary contributing factor.  His blood sugars within normal limits.  ET tube appears in the right place.  No evidence of gross external trauma.  Additionally CPR has been ongoing now for more than 1 hour and given an age and medical comorbidities I do not think that he has a good chance for functional recovery.  Demonstrated asystole on monitor on repeat pulse check was now noted to be asystolic with no central pulses palpated.  Time of death was called at 1720.  Given unclear nature of initial mechanism, medical examination was consulted for further evaluation.  They reviewed patient's chart and felt that this was natural nature.  No indication for further ME evaluation.    Final diagnoses:  Cardiac arrest [facility])    ED Discharge Orders     None          Laurita Sieving, MD November 08, 2024 2307  "

## 2024-10-27 NOTE — ED Notes (Addendum)
 PT's grandchildren at bedside, grandchildren state they do not want chaplain contacted, also deny PT status as organ donor.

## 2024-10-27 NOTE — ED Notes (Signed)
 Medical Examiner Angelica) contacted, states that ME will differ case at this time, will notify if this changes.

## 2024-10-27 NOTE — ED Notes (Addendum)
 [employer] contacted, Abby spoken to. Case # [id number]

## 2024-10-27 NOTE — ED Triage Notes (Signed)
 PT BIB [facility], found by mower at end of driveway at 1606. Uknown down time. Received 9 rounds of epi, 450mg  amiodarone, 2g Magnesium  en route. ROSC obtained at 1630 for 2 minutes before returning to PEA. Arrives with [facility] performing compressions. Unresponsive with no palpable pulse.

## 2024-10-27 DEATH — deceased
